# Patient Record
Sex: Female | Born: 1990
Health system: Southern US, Community
[De-identification: ages and names within clinical notes are randomized; demographics above are authoritative.]

## PROBLEM LIST (undated history)

## (undated) HISTORY — PX: NO PAST SURGERIES: SHX2092

---

## 2019-11-06 ENCOUNTER — Other Ambulatory Visit: Payer: Self-pay

## 2019-11-06 ENCOUNTER — Encounter (HOSPITAL_COMMUNITY): Payer: Self-pay | Admitting: Emergency Medicine

## 2019-11-06 ENCOUNTER — Other Ambulatory Visit (HOSPITAL_COMMUNITY): Payer: Medicaid Other

## 2019-11-06 ENCOUNTER — Ambulatory Visit (HOSPITAL_COMMUNITY)
Admission: EM | Admit: 2019-11-06 | Discharge: 2019-11-06 | Disposition: A | Discharge status: Home / Self Care | Payer: Medicaid Other | Attending: Emergency Medicine | Admitting: Emergency Medicine

## 2019-11-06 ENCOUNTER — Emergency Department (HOSPITAL_COMMUNITY): Payer: Medicaid Other

## 2019-11-06 ENCOUNTER — Emergency Department (HOSPITAL_COMMUNITY)
Admission: EM | Admit: 2019-11-06 | Discharge: 2019-11-06 | Disposition: A | Discharge status: Home / Self Care | Payer: Medicaid Other | Attending: Emergency Medicine | Admitting: Emergency Medicine

## 2019-11-06 DIAGNOSIS — N739 Female pelvic inflammatory disease, unspecified: Secondary | ICD-10-CM | POA: Insufficient documentation

## 2019-11-06 DIAGNOSIS — R1031 Right lower quadrant pain: Secondary | ICD-10-CM

## 2019-11-06 DIAGNOSIS — R1084 Generalized abdominal pain: Secondary | ICD-10-CM

## 2019-11-06 DIAGNOSIS — R110 Nausea: Secondary | ICD-10-CM | POA: Insufficient documentation

## 2019-11-06 DIAGNOSIS — R102 Pelvic and perineal pain: Secondary | ICD-10-CM

## 2019-11-06 DIAGNOSIS — N83201 Unspecified ovarian cyst, right side: Secondary | ICD-10-CM | POA: Insufficient documentation

## 2019-11-06 LAB — COMPREHENSIVE METABOLIC PANEL
ALT: 36 U/L (ref 0–44)
AST: 35 U/L (ref 15–41)
Albumin: 3.9 g/dL (ref 3.5–5.0)
Alkaline Phosphatase: 75 U/L (ref 38–126)
Anion gap: 9 (ref 5–15)
BUN: 8 mg/dL (ref 6–20)
CO2: 23 mmol/L (ref 22–32)
Calcium: 9.6 mg/dL (ref 8.9–10.3)
Chloride: 107 mmol/L (ref 98–111)
Creatinine, Ser: 0.81 mg/dL (ref 0.44–1.00)
GFR calc Af Amer: >60 mL/min (ref >60)
GFR calc non Af Amer: >60 mL/min (ref >60)
Glucose, Bld: 78 mg/dL (ref 70–99)
Potassium: 3.9 mmol/L (ref 3.5–5.1)
Sodium: 139 mmol/L (ref 135–145)
Total Bilirubin: 0.8 mg/dL (ref 0.3–1.2)
Total Protein: 8.3 g/dL — ABNORMAL HIGH (ref 6.5–8.1)

## 2019-11-06 LAB — URINALYSIS, ROUTINE W REFLEX MICROSCOPIC
Bilirubin Urine: NEGATIVE
Glucose, UA: NEGATIVE mg/dL
Hgb urine dipstick: NEGATIVE
Ketones, ur: 5 mg/dL — AB
Leukocytes,Ua: NEGATIVE
Nitrite: NEGATIVE
Protein, ur: NEGATIVE mg/dL
Specific Gravity, Urine: 1.005 (ref 1.005–1.030)
pH: 6 (ref 5.0–8.0)

## 2019-11-06 LAB — WET PREP, GENITAL
Clue Cells Wet Prep HPF POC: NONE SEEN
Trich, Wet Prep: NONE SEEN
Yeast Wet Prep HPF POC: NONE SEEN

## 2019-11-06 LAB — POCT URINALYSIS DIP (DEVICE)
Bilirubin Urine: NEGATIVE
Glucose, UA: NEGATIVE mg/dL
Ketones, ur: NEGATIVE mg/dL
Leukocytes,Ua: NEGATIVE
Nitrite: NEGATIVE
Protein, ur: NEGATIVE mg/dL
Specific Gravity, Urine: 1.025 (ref 1.005–1.030)
Urobilinogen, UA: 0.2 mg/dL (ref 0.0–1.0)
pH: 5.5 (ref 5.0–8.0)

## 2019-11-06 LAB — CBC
HCT: 37.2 % (ref 36.0–46.0)
Hemoglobin: 12.3 g/dL (ref 12.0–15.0)
MCH: 22.8 pg — ABNORMAL LOW (ref 26.0–34.0)
MCHC: 33.1 g/dL (ref 30.0–36.0)
MCV: 68.9 fL — ABNORMAL LOW (ref 80.0–100.0)
Platelets: 268 10*3/uL (ref 150–400)
RBC: 5.4 MIL/uL — ABNORMAL HIGH (ref 3.87–5.11)
RDW: 14.4 % (ref 11.5–15.5)
WBC: 4.5 10*3/uL (ref 4.0–10.5)
nRBC: 0 % (ref 0.0–0.2)

## 2019-11-06 LAB — LIPASE, BLOOD: Lipase: 35 U/L (ref 11–51)

## 2019-11-06 LAB — I-STAT BETA HCG BLOOD, ED (MC, WL, AP ONLY): I-stat hCG, quantitative: <5 m[IU]/mL (ref <5)

## 2019-11-06 MED ORDER — DOXYCYCLINE HYCLATE 100 MG PO CAPS: 100.0000 mg | ORAL_CAPSULE | Freq: Two times a day (BID) | ORAL | 0 refills | Status: AC

## 2019-11-06 MED ORDER — CEFTRIAXONE SODIUM 500 MG IJ SOLR
250.0000 mg | Freq: Once | INTRAMUSCULAR | Status: AC
  Administered 2019-11-06: 250 mg via INTRAMUSCULAR
  Filled 2019-11-06: qty 500

## 2019-11-06 MED ORDER — MORPHINE SULFATE (PF) 4 MG/ML IV SOLN
INTRAVENOUS | Status: AC
  Filled 2019-11-06: qty 1

## 2019-11-06 MED ORDER — SODIUM CHLORIDE 0.9% FLUSH: 3.0000 mL | Freq: Once | INTRAVENOUS | Status: DC

## 2019-11-06 MED ORDER — NAPROXEN 500 MG PO TABS: 500.0000 mg | ORAL_TABLET | Freq: Two times a day (BID) | ORAL | 0 refills | Status: DC

## 2019-11-06 MED ORDER — KETOROLAC TROMETHAMINE 30 MG/ML IJ SOLN
30.0000 mg | Freq: Once | INTRAMUSCULAR | Status: AC
  Administered 2019-11-06: 30 mg via INTRAVENOUS
  Filled 2019-11-06: qty 1

## 2019-11-06 MED ORDER — DOXYCYCLINE HYCLATE 100 MG PO TABS
100.0000 mg | ORAL_TABLET | Freq: Once | ORAL | Status: AC
  Administered 2019-11-06: 100 mg via ORAL
  Filled 2019-11-06: qty 1

## 2019-11-06 MED ORDER — MORPHINE SULFATE (PF) 4 MG/ML IV SOLN
2.0000 mg | Freq: Once | INTRAVENOUS | Status: AC
  Administered 2019-11-06: 2 mg via INTRAMUSCULAR

## 2019-11-06 MED ORDER — LIDOCAINE HCL (PF) 1 % IJ SOLN
INTRAMUSCULAR | Status: AC
  Administered 2019-11-06: 5 mL
  Filled 2019-11-06: qty 5

## 2019-11-06 NOTE — ED Triage Notes (Signed)
PT sent from [facility] for RLQ pain that has been intermittent x 1 month and severe after intercourse today with mild nausea.  States her pain was 10/10 but is now 0/10 after receiving Morphine at [facility].

## 2019-11-06 NOTE — ED Provider Notes (Signed)
[facility] EMERGENCY DEPARTMENT Provider Note   CSN: [id number] Arrival date & time: 11/06/19  1205     History Chief Complaint  Patient presents with  . Abdominal Pain    Diamond Berry is a 29 y.o. female who presents to ED with a chief complaint of abdominal pain.  Reports intermittent right lower quadrant pain for the past month.  Pain got severely worse today after having intercourse with her boyfriend.  Pain will completely resolve without specific alleviating factor.  Reports nausea but denies any vomiting.  Denies any vaginal discharge, concern for STDs, urinary symptoms.  She has a history of constipation which is worsened by her p.o. iron but no changes to her bowel movements from baseline.  She denies stability of pregnancy as she has had an IUD for the past 5 years.  Denies any sick contacts with similar symptoms, prior abdominal surgeries, fever, shortness of breath.  HPI     History reviewed. No pertinent past medical history.  There are no problems to display for this patient.   History reviewed. No pertinent surgical history.   OB History   No obstetric history on file.     Family History  Problem Relation Age of Onset  . Hypertension Mother   . Hypertension Father   . Thyroid disease Sister     Social History   Tobacco Use  . Smoking status: Never Smoker  Substance Use Topics  . Alcohol use: Yes  . Drug use: Yes    Types: Marijuana    Home Medications Prior to Admission medications   Medication Sig Start Date End Date Taking? Authorizing Provider  doxycycline (VIBRAMYCIN) 100 MG capsule Take 1 capsule (100 mg total) by mouth 2 (two) times daily for 14 days. 11/06/19 11/20/19  Abdiel Blackerby, PA-C  naproxen (NAPROSYN) 500 MG tablet Take 1 tablet (500 mg total) by mouth 2 (two) times daily. 11/06/19   Delia Heady, PA-C    Allergies    Patient has no known allergies.  Review of Systems   Review of Systems  Constitutional:  Negative for appetite change, chills and fever.  HENT: Negative for ear pain, rhinorrhea, sneezing and sore throat.   Eyes: Negative for photophobia and visual disturbance.  Respiratory: Negative for cough, chest tightness, shortness of breath and wheezing.   Cardiovascular: Negative for chest pain and palpitations.  Gastrointestinal: Positive for abdominal pain. Negative for blood in stool, constipation, diarrhea, nausea and vomiting.  Genitourinary: Positive for dyspareunia and pelvic pain. Negative for dysuria, hematuria and urgency.  Musculoskeletal: Negative for myalgias.  Skin: Negative for rash.  Neurological: Negative for dizziness, weakness and light-headedness.    Physical Exam Updated Vital Signs BP (!) 102/59 (BP Location: Right Arm)   Pulse 63   Temp 98.4 F (36.9 C) (Oral)   Resp 16   LMP 10/19/2019   SpO2 100%   Physical Exam Vitals and nursing note reviewed. Exam conducted with a chaperone present.  Constitutional:      General: She is not in acute distress.    Appearance: She is well-developed.  HENT:     Head: Normocephalic and atraumatic.     Nose: Nose normal.  Eyes:     General: No scleral icterus.       Left eye: No discharge.     Conjunctiva/sclera: Conjunctivae normal.  Cardiovascular:     Rate and Rhythm: Normal rate and regular rhythm.     Heart sounds: Normal heart sounds. No murmur. No friction  rub. No gallop.   Pulmonary:     Effort: Pulmonary effort is normal. No respiratory distress.     Breath sounds: Normal breath sounds.  Abdominal:     General: Bowel sounds are normal. There is no distension.     Palpations: Abdomen is soft.     Tenderness: There is no abdominal tenderness. There is no guarding.  Genitourinary:    Vagina: No erythema or bleeding.     Cervix: No cervical motion tenderness.     Uterus: Tender.      Adnexa:        Right: No tenderness.         Left: No tenderness.       Comments: Pelvic exam: normal external genitalia  without evidence of trauma. VULVA: normal appearing vulva with no masses, tenderness or lesion. VAGINA: normal appearing vagina with normal color and discharge, no lesions. CERVIX: normal appearing cervix without lesions, cervical motion tenderness absent, cervical os closed with out purulent discharge; No vaginal discharge. Wet prep and DNA probe for chlamydia and GC obtained. IUD strings visualized   ADNEXA: normal adnexa in size, nontender and no masses UTERUS: uterus is normal size, shape, consistency with slight TTP on bimanual exam. Musculoskeletal:        General: Normal range of motion.     Cervical back: Normal range of motion and neck supple.  Skin:    General: Skin is warm and dry.     Findings: No rash.  Neurological:     Mental Status: She is alert.     Motor: No abnormal muscle tone.     Coordination: Coordination normal.     ED Results / Procedures / Treatments   Labs (all labs ordered are listed, but only abnormal results are displayed) Labs Reviewed  WET PREP, GENITAL - Abnormal; Notable for the following components:      Result Value   WBC, Wet Prep HPF POC MODERATE (*)    All other components within normal limits  COMPREHENSIVE METABOLIC PANEL - Abnormal; Notable for the following components:   Total Protein 8.3 (*)    All other components within normal limits  CBC - Abnormal; Notable for the following components:   RBC 5.40 (*)    MCV 68.9 (*)    MCH 22.8 (*)    All other components within normal limits  URINALYSIS, ROUTINE W REFLEX MICROSCOPIC - Abnormal; Notable for the following components:   Color, Urine STRAW (*)    Ketones, ur 5 (*)    All other components within normal limits  LIPASE, BLOOD  I-STAT BETA HCG BLOOD, ED ([facility], [facility], [facility] ONLY)  GC/CHLAMYDIA PROBE AMP ([place]) NOT AT [facility]    EKG None  Radiology US PELVIC COMPLETE W TRANSVAGINAL AND TORSION R/O  Result Date: 11/06/2019 CLINICAL DATA:  Right lower quadrant and right pelvic pain.  EXAM: TRANSABDOMINAL AND TRANSVAGINAL ULTRASOUND OF PELVIS DOPPLER ULTRASOUND OF OVARIES TECHNIQUE: Both transabdominal and transvaginal ultrasound examinations of the pelvis were performed. Transabdominal technique was performed for global imaging of the pelvis including uterus, ovaries, adnexal regions, and pelvic cul-de-sac. It was necessary to proceed with endovaginal exam following the transabdominal exam to visualize the ovaries. Color and duplex Doppler ultrasound was utilized to evaluate blood flow to the ovaries. COMPARISON:  None. FINDINGS: Uterus Measurements: 7 x 4.2 x 5.6 cm = volume: 86 mL.  An IUD is noted. Endometrium Thickness: 5.2 mm.  No focal abnormality visualized. Right ovary Measurements: 5.4 x 2.5 x  3.9 cm = volume: 27.8 mL. There is a complex 2.7 x 2.7 x 2.3 cm mass. The mass demonstrates low level internal echoes. There is increased through transmission. Left ovary Measurements: 2.9 x 1.2 x 2.4 cm = volume: 4 mL. Normal appearance/no adnexal mass. Pulsed Doppler evaluation of both ovaries demonstrates normal low-resistance arterial and venous waveforms. Other findings There is a small amount of free fluid in the patient's pelvis. IMPRESSION: 1. No evidence for ovarian torsion. 2. Small amount of pelvic free fluid, likely physiologic. 3. Probable 2.7 cm hemorrhagic cyst involving the right ovary. Electronically Signed   By: Katherine Mantle M.D.   On: 11/06/2019 15:54    Procedures Procedures (including critical care time)  Medications Ordered in ED Medications  sodium chloride flush (NS) 0.9 % injection 3 mL (has no administration in time range)  cefTRIAXone (ROCEPHIN) injection 250 mg (has no administration in time range)  doxycycline (VIBRA-TABS) tablet 100 mg (has no administration in time range)  ketorolac (TORADOL) 30 MG/ML injection 30 mg (30 mg Intravenous Given 11/06/19 1355)    ED Course  I have reviewed the triage vital signs and the nursing notes.  Pertinent  labs & imaging results that were available during my care of the patient were reviewed by me and considered in my medical decision making (see chart for details).    MDM Rules/Calculators/A&P                      29 year old female presenting to the ED with lower abdominal/pelvic pain.  Pain has been intermittent for the past month but worsened this morning while having intercourse with her boyfriend.  She is sent over from urgent care due to her location and severity of pain.  Overall she is well-appearing after receiving morphine by urgent care.  Her pelvic exam revealed no cervical motion tenderness but she did have extreme discomfort with the speculum exam itself.  I was able to visualize her IUD strings in place.  She had no adnexal tenderness.  Work appears significant for normal CBC, CMP, lipase, urinalysis and negative hCG.  Wet prep positive for WBC but no other abnormalities.  Pelvic ultrasound revealed a right sided hemorrhagic ovarian cyst. This could be the cause of her discomfort but due to her tenderness during speculum exam will cover her for PID.  Discussed risks and benefits of further imaging with a CT scan and patient declines but knows to return to the ED for any worsening symptoms.  Patient is hemodynamically stable, in NAD, and able to ambulate in the ED. Evaluation does not show pathology that would require ongoing emergent intervention or inpatient treatment. I explained the diagnosis to the patient. Pain has been managed and has no complaints prior to discharge. Patient is comfortable with above plan and is stable for discharge at this time. All questions were answered prior to disposition. Strict return precautions for returning to the ED were discussed. Encouraged follow up with PCP.   An After Visit Summary was printed and given to the patient.   Portions of this note were generated with Scientist, clinical (histocompatibility and immunogenetics). Dictation errors may occur despite best attempts at  proofreading.  Final Clinical Impression(s) / ED Diagnoses Final diagnoses:  Cyst of right ovary  Pelvic inflammatory disease (PID)    Rx / DC Orders ED Discharge Orders         Ordered    doxycycline (VIBRAMYCIN) 100 MG capsule  2 times daily  11/06/19 1623    naproxen (NAPROSYN) 500 MG tablet  2 times daily     11/06/19 1623           Dietrich Pates, PA-C 11/06/19 1623    Gerhard Munch, MD 11/11/19 [phone]

## 2019-11-06 NOTE — ED Triage Notes (Signed)
Pt states shes had some lower abdominal pain off and on x1 month, then states she had intercourse with her boyfriend today and afterward she had severe lower abdominal pain. Pt states she has an IUD. Tender to palpation on RLQ.

## 2019-11-06 NOTE — ED Provider Notes (Addendum)
HPI  SUBJECTIVE:  Diamond Berry is a 29 y.o. female who presents with intermittent right lower quadrant pain for the past month.  Was having intercourse today with her boyfriend, states that the pain got acutely worse.  She denies dyspareunia.  She describes the pain as sharp, intermittent, minutes long.  Does not migrate or radiate. when it comes, it is the worst at onset, and then completely resolves.  She reports nausea when the pain is present.  No vomiting, fevers.  No abdominal distention.  She reports an episode of low abdominal pressure with urination today, but this has resolved.  She denies dysuria, urinary urgency, frequency, cloudy or odorous urine, hematuria.  No vaginal itching, discharge, odor genital rash.  She is in a long-term monogamous relationship with a female who is asymptomatic, STDs are not a concern today.  She denies back pain.  Reports anorexia.  She states that the pain is better when she curls into a fetal position, and stands.  Symptoms are worse with large positional changes/movement.  Is not associate with eating drinking urination or defecation.  Patient states that the car ride over here was painful.  She had a normal bowel movement yesterday.  She has never had symptoms like this before.  Past medical history negative for PCOS, torsion, ovarian cyst, gonorrhea, chlamydia, HIV, HSV, syphilis, trichomonas, BV, PID, ectopic pregnancies, abdominal surgeries.  She has history of yeast infections.  LMP: 1/6-1/9 has an IUD.  Doubts that she could be pregnant.  PMD: None.    History reviewed. No pertinent past medical history.  History reviewed. No pertinent surgical history.  Family History  Problem Relation Age of Onset  . Hypertension Mother   . Hypertension Father   . Thyroid disease Sister     Social History   Tobacco Use  . Smoking status: Never Smoker  Substance Use Topics  . Alcohol use: Yes  . Drug use: Yes    Types: Marijuana     Current  Facility-Administered Medications:  .  morphine 4 MG/ML injection 2 mg, 2 mg, Intramuscular, Once, Melynda Ripple, MD No current outpatient medications on file.  No Known Allergies   ROS  As noted in HPI.   Physical Exam  BP (!) 109/49   Pulse 73   Temp 98.8 F (37.1 C)   Resp 16   SpO2 100%   Constitutional: Well developed, well nourished, no acute distress. Eyes:  EOMI, conjunctiva normal bilaterally HENT: Normocephalic, atraumatic,mucus membranes moist Respiratory: Normal inspiratory effort Cardiovascular: Normal rate GI: Normal appearance, flat, soft.  Positive left flank, periumbilical, and suprapubic tenderness.  No right lower quadrant tenderness.  Negative Murphy.  Voluntary guarding.  No guarding, rebound.  Tap table test negative. Back: No CVAT skin: No rash, skin intact Musculoskeletal: no deformities Neurologic: Alert & oriented x 3, no focal neuro deficits Psychiatric: Speech and behavior appropriate   ED Course   Medications  morphine 4 MG/ML injection 2 mg (has no administration in time range)    No orders of the defined types were placed in this encounter.   No results found for this or any previous visit (from the past 24 hour(s)). No results found.  ED Clinical Impression  1. Generalized abdominal pain      ED Assessment/Plan  History was concerning for ovarian torsion, however she has no tenderness in the right lower quadrant.  Doubt appendicitis due to duration of symptoms.  She does have periumbilical, left flank and suprapubic tenderness.  Physical exam  was very painful, patient started crying and was requested something for pain.  Declined nausea medication.  She doubts that she could be pregnant, LMP was January 6-9, she has an IUD.  Feel that we need more information before we can safely send her home.  Ovarian cyst versus torsion based on history, colitis, pancreatitis, UTI, nephrolithiasis based on exam.  Also in the differential is  IUD displacement.  Doubt uterine perforation.  Transferring to the emergency department for comprehensive evaluation.  Giving 2 mg of morphine IM prior to discharge.  Advised her to be n.p.o. until ER evaluation is complete.  Discussed  MDM, treatment plan, and rationale for transfer to the emergency department.  Patient agrees the plan  Meds ordered this encounter  Medications  . morphine 4 MG/ML injection 2 mg    *This clinic note was created using Scientist, clinical (histocompatibility and immunogenetics). Therefore, there may be occasional mistakes despite careful proofreading.   ?    Domenick Gong, MD 11/06/19 1152    Domenick Gong, MD 11/06/19 1155

## 2019-11-06 NOTE — Discharge Instructions (Addendum)
I am giving you 2 mg of morphine for the pain to help you while you are waiting to be seen.  Do not have anything further to eat or drink until your ER evaluation is complete.  Let them know if your pain changes, gets worse, or for any other concerns.

## 2019-11-06 NOTE — Discharge Instructions (Signed)
Take the antibiotics for the entire course prevent worsening or recurrence of your infection. We will follow up with the results of your lab work when it is available. Take naproxen as needed for pain. Return to the ED if you start to develop worsening pain, abnormal vaginal bleeding, inability to tolerate anything by mouth, shortness of breath.

## 2019-11-06 NOTE — ED Notes (Signed)
Patient is being discharged from the Urgent Care Center and sent to the Emergency Department via wheelchair by staff. Per Dr Terrilee Croak, patient is stable but in need of higher level of care due to severe abdominal pain. Patient is aware and verbalizes understanding of plan of care.    Vitals:   11/06/19 1056  BP: (!) 109/49  Pulse: 73  Resp: 16  Temp: 98.8 F (37.1 C)  SpO2: 100%

## 2019-11-06 NOTE — ED Notes (Signed)
DC instructions reviewed with PT.

## 2019-11-07 ENCOUNTER — Telehealth: Payer: Self-pay | Admitting: General Practice

## 2019-11-07 LAB — GC/CHLAMYDIA PROBE AMP (~~LOC~~) NOT AT ARMC
Chlamydia: NEGATIVE
Neisseria Gonorrhea: NEGATIVE

## 2019-11-25 ENCOUNTER — Other Ambulatory Visit: Payer: Self-pay

## 2019-11-25 ENCOUNTER — Encounter: Payer: Self-pay | Admitting: Internal Medicine

## 2019-11-25 ENCOUNTER — Ambulatory Visit: Payer: Medicaid Other | Attending: Internal Medicine | Admitting: Internal Medicine

## 2019-11-25 VITALS — BP 120/78 | HR 65 | Ht 63.0 in | Wt 138.0 lb

## 2019-11-25 DIAGNOSIS — F419 Anxiety disorder, unspecified: Secondary | ICD-10-CM | POA: Diagnosis not present

## 2019-11-25 DIAGNOSIS — Z2821 Immunization not carried out because of patient refusal: Secondary | ICD-10-CM

## 2019-11-25 DIAGNOSIS — Z833 Family history of diabetes mellitus: Secondary | ICD-10-CM | POA: Insufficient documentation

## 2019-11-25 DIAGNOSIS — Z6379 Other stressful life events affecting family and household: Secondary | ICD-10-CM | POA: Diagnosis not present

## 2019-11-25 DIAGNOSIS — N83201 Unspecified ovarian cyst, right side: Secondary | ICD-10-CM | POA: Insufficient documentation

## 2019-11-25 DIAGNOSIS — Z3009 Encounter for other general counseling and advice on contraception: Secondary | ICD-10-CM

## 2019-11-25 DIAGNOSIS — Z308 Encounter for other contraceptive management: Secondary | ICD-10-CM | POA: Diagnosis not present

## 2019-11-25 NOTE — Progress Notes (Signed)
Patient ID: Diamond Berry, female    DOB: Feb 23, 1991  MRN: [id number]  CC: Hospitalization Follow-up   Subjective: Diamond Berry is a 29 y.o. female who presents for ER f/u and new pt visit Her concerns today include:   No previous PCP here in [place].  Relocated from [place] 05/2019 No chronic med illness  Patient seen in the emergency room 11/06/2019 with some right lower quadrant abdominal pain.  Pelvic ultrasound revealed a right hemorrhagic ovarian cyst.  Urine pregnancy test was negative.  Screen for GC and Chlamydia were negative.  IUD noted to be in place on physical exam.  Patient discharged on Naprosyn.  She reports that the pain is less frequent. Wants IUD removed.  Had it since 2016. Was on Depo in past but changed to IUD out of convenience.  She would like to get back on the Depo-Provera.   No wgh gain or irregular bleeding when she was on Depo in the past.  Last PAP was within the past yr and was nl.  It was done in [place].  Wants referral to see a therapist Feeling overwhelm.  She is a single mom with 2 kids and is in the middle of a divorce. Needs someone to talk to Saw therapist in past with her then husband and found it helpful Poor appetite some days.  Feels tired all the time.  + crying spells.  She gets up and goes to work every day On med for chronic daily in past for daily HA and was told it may be antidepressant but other than that she has never been on an antidepressant.  She is not interested in being placed on medication at this time.  She denies any suicidal ideation.   Past medical, social, family history and surgical history is reviewed and updated. Current Outpatient Medications on File Prior to Visit  Medication Sig Dispense Refill  . naproxen (NAPROSYN) 500 MG tablet Take 1 tablet (500 mg total) by mouth 2 (two) times daily. (Patient not taking: Reported on 11/25/2019) 30 tablet 0   No current facility-administered medications on file prior to  visit.    No Known Allergies  Social History   Socioeconomic History  . Marital status: Single    Spouse name: Not on file  . Number of children: 2  . Years of education: Not on file  . Highest education level: Not on file  Occupational History  . Occupation: Print production planner  Tobacco Use  . Smoking status: Never Smoker  . Smokeless tobacco: Never Used  Substance and Sexual Activity  . Alcohol use: Yes    Comment: special occasions  . Drug use: Yes    Frequency: 1.0 times per week    Types: Marijuana  . Sexual activity: Yes    Birth control/protection: I.U.D.  Other Topics Concern  . Not on file  Social History Narrative  . Not on file   Social Determinants of Health   Financial Resource Strain:   . Difficulty of Paying Living Expenses: Not on file  Food Insecurity:   . Worried About Programme researcher, broadcasting/film/video in the Last Year: Not on file  . Ran Out of Food in the Last Year: Not on file  Transportation Needs:   . Lack of Transportation (Medical): Not on file  . Lack of Transportation (Non-Medical): Not on file  Physical Activity:   . Days of Exercise per Week: Not on file  . Minutes of Exercise per Session: Not on file  Stress:   . Feeling of Stress : Not on file  Social Connections:   . Frequency of Communication with Friends and Family: Not on file  . Frequency of Social Gatherings with Friends and Family: Not on file  . Attends Religious Services: Not on file  . Active Member of Clubs or Organizations: Not on file  . Attends Archivist Meetings: Not on file  . Marital Status: Not on file  Intimate Partner Violence:   . Fear of Current or Ex-Partner: Not on file  . Emotionally Abused: Not on file  . Physically Abused: Not on file  . Sexually Abused: Not on file    Family History  Problem Relation Age of Onset  . Hypertension Mother   . Diabetes Mother   . Hypertension Father   . Thyroid disease Sister   . Hypertension Sister     Past Surgical  History:  Procedure Laterality Date  . NO PAST SURGERIES      ROS: Review of Systems Negative except as stated above  PHYSICAL EXAM: BP 120/78   Pulse 65   Ht 5\' 3"  (1.6 m)   Wt 138 lb (62.6 kg)   SpO2 100%   BMI 24.45 kg/m   Physical Exam  General appearance - alert, well appearing, young African-American female and in no distress Mental status - normal mood, behavior, speech, dress, motor activity, and thought processes Chest - clear to auscultation, no wheezes, rales or rhonchi, symmetric air entry Heart - normal rate, regular rhythm, normal S1, S2, no murmurs, rubs, clicks or gallops Extremities - peripheral pulses normal, no pedal edema, no clubbing or cyanosis  Depression screen PHQ 2/9 11/25/2019  Decreased Interest 2  Down, Depressed, Hopeless 1  PHQ - 2 Score 3  Altered sleeping 2  Tired, decreased energy 0  Change in appetite 1  Feeling bad or failure about yourself  2  Trouble concentrating 0  Moving slowly or fidgety/restless 0  Suicidal thoughts 0  PHQ-9 Score 8   GAD 7 : Generalized Anxiety Score 11/25/2019  Nervous, Anxious, on Edge 3  Control/stop worrying 3  Worry too much - different things 3  Trouble relaxing 1  Restless 0  Easily annoyed or irritable 2  Afraid - awful might happen 2  Total GAD 7 Score 14     CMP Latest Ref Rng & Units 11/06/2019  Glucose 70 - 99 mg/dL 78  BUN 6 - 20 mg/dL 8  Creatinine 0.44 - 1.00 mg/dL 0.81  Sodium 135 - 145 mmol/L 139  Potassium 3.5 - 5.1 mmol/L 3.9  Chloride 98 - 111 mmol/L 107  CO2 22 - 32 mmol/L 23  Calcium 8.9 - 10.3 mg/dL 9.6  Total Protein 6.5 - 8.1 g/dL 8.3(H)  Total Bilirubin 0.3 - 1.2 mg/dL 0.8  Alkaline Phos 38 - 126 U/L 75  AST 15 - 41 U/L 35  ALT 0 - 44 U/L 36   Lipid Panel  No results found for: CHOL, TRIG, HDL, CHOLHDL, VLDL, LDLCALC, LDLDIRECT  CBC    Component Value Date/Time   WBC 4.5 11/06/2019 1218   RBC 5.40 (H) 11/06/2019 1218   HGB 12.3 11/06/2019 1218   HCT 37.2  11/06/2019 1218   PLT 268 11/06/2019 1218   MCV 68.9 (L) 11/06/2019 1218   MCH 22.8 (L) 11/06/2019 1218   MCHC 33.1 11/06/2019 1218   RDW 14.4 11/06/2019 1218    ASSESSMENT AND PLAN: 1. Stressful life event affecting family Referral placed to behavioral  health with request for her to see a counselor. Discussed ways to help reduce stress including regular exercise, eating healthy, getting adequate sleep, breathing exercises - Ambulatory referral to Psychiatry  2. Family planning We will refer her to GYN to have her IUD removed - Ambulatory referral to Gynecology  3. Cyst of right ovary - Ambulatory referral to Gynecology  4. Influenza vaccination declined This was offered and patient declined  5. Tetanus, diphtheria, and acellular pertussis (Tdap) vaccination declined This was offered and patient declined    Patient was given the opportunity to ask questions.  Patient verbalized understanding of the plan and was able to repeat key elements of the plan.   Orders Placed This Encounter  Procedures  . Ambulatory referral to Gynecology  . Ambulatory referral to Psychiatry     Requested Prescriptions    No prescriptions requested or ordered in this encounter    Return if symptoms worsen or fail to improve.  Jonah Blue, MD, FACP

## 2019-11-25 NOTE — Patient Instructions (Signed)
Stress, Adult Stress is a normal reaction to life events. Stress is what you feel when life demands more than you are used to, or more than you think you can handle. Some stress can be useful, such as studying for a test or meeting a deadline at work. Stress that occurs too often or for too long can cause problems. It can affect your emotional health and interfere with relationships and normal daily activities. Too much stress can weaken your body's defense system (immune system) and increase your risk for physical illness. If you already have a medical problem, stress can make it worse. What are the causes? All sorts of life events can cause stress. An event that causes stress for one person may not be stressful for another person. Major life events, whether positive or negative, commonly cause stress. Examples include:  Losing a job or starting a new job.  Losing a loved one.  Moving to a new town or home.  Getting married or divorced.  Having a baby.  Getting injured or sick. Less obvious life events can also cause stress, especially if they occur day after day or in combination with each other. Examples include:  Working long hours.  Driving in traffic.  Caring for children.  Being in debt.  Being in a difficult relationship. What are the signs or symptoms? Stress can cause emotional symptoms, including:  Anxiety. This is feeling worried, afraid, on edge, overwhelmed, or out of control.  Anger, including irritation or impatience.  Depression. This is feeling sad, down, helpless, or guilty.  Trouble focusing, remembering, or making decisions. Stress can cause physical symptoms, including:  Aches and pains. These may affect your head, neck, back, stomach, or other areas of your body.  Tight muscles or a clenched jaw.  Low energy.  Trouble sleeping. Stress can cause unhealthy behaviors, including:  Eating to feel better (overeating) or skipping meals.  Working too  much or putting off tasks.  Smoking, drinking alcohol, or using drugs to feel better. How is this diagnosed? Stress is diagnosed through an assessment by your health care provider. He or she may diagnose this condition based on:  Your symptoms and any stressful life events.  Your medical history.  Tests to rule out other causes of your symptoms. Depending on your condition, your health care provider may refer you to a specialist for further evaluation. How is this treated?  Stress management techniques are the recommended treatment for stress. Medicine is not typically recommended for the treatment of stress. Techniques to reduce your reaction to stressful life events include:  Stress identification. Monitor yourself for symptoms of stress and identify what causes stress for you. These skills may help you to avoid or prepare for stressful events.  Time management. Set your priorities, keep a calendar of events, and learn to say no. Taking these actions can help you avoid making too many commitments. Techniques for coping with stress include:  Rethinking the problem. Try to think realistically about stressful events rather than ignoring them or overreacting. Try to find the positives in a stressful situation rather than focusing on the negatives.  Exercise. Physical exercise can release both physical and emotional tension. The key is to find a form of exercise that you enjoy and do it regularly.  Relaxation techniques. These relax the body and mind. The key is to find one or more that you enjoy and use the techniques regularly. Examples include: ? Meditation, deep breathing, or progressive relaxation techniques. ? Yoga or   tai chi. ? Biofeedback, mindfulness techniques, or journaling. ? Listening to music, being out in nature, or participating in other hobbies.  Practicing a healthy lifestyle. Eat a balanced diet, drink plenty of water, limit or avoid caffeine, and get plenty of  sleep.  Having a strong support network. Spend time with family, friends, or other people you enjoy being around. Express your feelings and talk things over with someone you trust. Counseling or talk therapy with a mental health professional may be helpful if you are having trouble managing stress on your own. Follow these instructions at home: Lifestyle   Avoid drugs.  Do not use any products that contain nicotine or tobacco, such as cigarettes, e-cigarettes, and chewing tobacco. If you need help quitting, ask your health care provider.  Limit alcohol intake to no more than 1 drink a day for nonpregnant women and 2 drinks a day for men. One drink equals 12 oz of beer, 5 oz of wine, or 1 oz of hard liquor  Do not use alcohol or drugs to relax.  Eat a balanced diet that includes fresh fruits and vegetables, whole grains, lean meats, fish, eggs, and beans, and low-fat dairy. Avoid processed foods and foods high in added fat, sugar, and salt.  Exercise at least 30 minutes on 5 or more days each week.  Get 7-8 hours of sleep each night. General instructions   Practice stress management techniques as discussed with your health care provider.  Drink enough fluid to keep your urine clear or pale yellow.  Take over-the-counter and prescription medicines only as told by your health care provider.  Keep all follow-up visits as told by your health care provider. This is important. Contact a health care provider if:  Your symptoms get worse.  You have new symptoms.  You feel overwhelmed by your problems and can no longer manage them on your own. Get help right away if:  You have thoughts of hurting yourself or others. If you ever feel like you may hurt yourself or others, or have thoughts about taking your own life, get help right away. You can go to your nearest emergency department or call:  Your local emergency services (911 in the [place]  A suicide crisis helpline, such as the  [place] at (316) 250-6172. This is open 24 hours a day. Summary  Stress is a normal reaction to life events. It can cause problems if it happens too often or for too long.  Practicing stress management techniques is the best way to treat stress.  Counseling or talk therapy with a mental health professional may be helpful if you are having trouble managing stress on your own. This information is not intended to replace advice given to you by your health care provider. Make sure you discuss any questions you have with your health care provider. Document Revised: 04/29/2019 Document Reviewed: 11/19/2016 Elsevier Patient Education  [place].

## 2019-11-25 NOTE — Progress Notes (Signed)
Wants to discuss new form of birth control.

## 2020-01-04 ENCOUNTER — Ambulatory Visit (INDEPENDENT_AMBULATORY_CARE_PROVIDER_SITE_OTHER): Payer: Medicaid Other | Admitting: Obstetrics and Gynecology

## 2020-01-04 ENCOUNTER — Encounter: Payer: Self-pay | Admitting: Obstetrics and Gynecology

## 2020-01-04 ENCOUNTER — Other Ambulatory Visit: Payer: Self-pay

## 2020-01-04 VITALS — BP 116/62 | HR 82 | Ht 63.0 in | Wt 132.7 lb

## 2020-01-04 DIAGNOSIS — Z3042 Encounter for surveillance of injectable contraceptive: Secondary | ICD-10-CM | POA: Diagnosis present

## 2020-01-04 DIAGNOSIS — Z30432 Encounter for removal of intrauterine contraceptive device: Secondary | ICD-10-CM

## 2020-01-04 MED ORDER — MEDROXYPROGESTERONE ACETATE 150 MG/ML IM SUSP
150.0000 mg | Freq: Once | INTRAMUSCULAR | Status: AC
  Administered 2020-01-04: 150 mg via INTRAMUSCULAR

## 2020-01-04 MED ORDER — MEDROXYPROGESTERONE ACETATE 150 MG/ML IM SUSP: 150.0000 mg | INTRAMUSCULAR | 3 refills | Status: DC

## 2020-01-04 NOTE — Patient Instructions (Signed)
Health Maintenance, Female Adopting a healthy lifestyle and getting preventive care are important in promoting health and wellness. Ask your health care provider about:  The right schedule for you to have regular tests and exams.  Things you can do on your own to prevent diseases and keep yourself healthy. What should I know about diet, weight, and exercise? Eat a healthy diet   Eat a diet that includes plenty of vegetables, fruits, low-fat dairy products, and lean protein.  Do not eat a lot of foods that are high in solid fats, added sugars, or sodium. Maintain a healthy weight Body mass index (BMI) is used to identify weight problems. It estimates body fat based on height and weight. Your health care provider can help determine your BMI and help you achieve or maintain a healthy weight. Get regular exercise Get regular exercise. This is one of the most important things you can do for your health. Most adults should:  Exercise for at least 150 minutes each week. The exercise should increase your heart rate and make you sweat (moderate-intensity exercise).  Do strengthening exercises at least twice a week. This is in addition to the moderate-intensity exercise.  Spend less time sitting. Even light physical activity can be beneficial. Watch cholesterol and blood lipids Have your blood tested for lipids and cholesterol at 31 years of age, then have this test every 5 years. Have your cholesterol levels checked more often if:  Your lipid or cholesterol levels are high.  You are older than 31 years of age.  You are at high risk for heart disease. What should I know about cancer screening? Depending on your health history and family history, you may need to have cancer screening at various ages. This may include screening for:  Breast cancer.  Cervical cancer.  Colorectal cancer.  Skin cancer.  Lung cancer. What should I know about heart disease, diabetes, and high blood  pressure? Blood pressure and heart disease  High blood pressure causes heart disease and increases the risk of stroke. This is more likely to develop in people who have high blood pressure readings, are of African descent, or are overweight.  Have your blood pressure checked: ? Every 3-5 years if you are 18-39 years of age. ? Every year if you are 40 years old or older. Diabetes Have regular diabetes screenings. This checks your fasting blood sugar level. Have the screening done:  Once every three years after age 40 if you are at a normal weight and have a low risk for diabetes.  More often and at a younger age if you are overweight or have a high risk for diabetes. What should I know about preventing infection? Hepatitis B If you have a higher risk for hepatitis B, you should be screened for this virus. Talk with your health care provider to find out if you are at risk for hepatitis B infection. Hepatitis C Testing is recommended for:  Everyone born from 1945 through 1965.  Anyone with known risk factors for hepatitis C. Sexually transmitted infections (STIs)  Get screened for STIs, including gonorrhea and chlamydia, if: ? You are sexually active and are younger than 31 years of age. ? You are older than 31 years of age and your health care provider tells you that you are at risk for this type of infection. ? Your sexual activity has changed since you were last screened, and you are at increased risk for chlamydia or gonorrhea. Ask your health care provider if   you are at risk.  Ask your health care provider about whether you are at high risk for HIV. Your health care provider may recommend a prescription medicine to help prevent HIV infection. If you choose to take medicine to prevent HIV, you should first get tested for HIV. You should then be tested every 3 months for as long as you are taking the medicine. Pregnancy  If you are about to stop having your period (premenopausal) and  you may become pregnant, seek counseling before you get pregnant.  Take 400 to 800 micrograms (mcg) of folic acid every day if you become pregnant.  Ask for birth control (contraception) if you want to prevent pregnancy. Osteoporosis and menopause Osteoporosis is a disease in which the bones lose minerals and strength with aging. This can result in bone fractures. If you are 65 years old or older, or if you are at risk for osteoporosis and fractures, ask your health care provider if you should:  Be screened for bone loss.  Take a calcium or vitamin D supplement to lower your risk of fractures.  Be given hormone replacement therapy (HRT) to treat symptoms of menopause. Follow these instructions at home: Lifestyle  Do not use any products that contain nicotine or tobacco, such as cigarettes, e-cigarettes, and chewing tobacco. If you need help quitting, ask your health care provider.  Do not use street drugs.  Do not share needles.  Ask your health care provider for help if you need support or information about quitting drugs. Alcohol use  Do not drink alcohol if: ? Your health care provider tells you not to drink. ? You are pregnant, may be pregnant, or are planning to become pregnant.  If you drink alcohol: ? Limit how much you use to 0-1 drink a day. ? Limit intake if you are breastfeeding.  Be aware of how much alcohol is in your drink. In the [place] one drink equals one 12 oz bottle of beer (355 mL), one 5 oz glass of wine (148 mL), or one 1 oz glass of hard liquor (44 mL). General instructions  Schedule regular health, dental, and eye exams.  Stay current with your vaccines.  Tell your health care provider if: ? You often feel depressed. ? You have ever been abused or do not feel safe at home. Summary  Adopting a healthy lifestyle and getting preventive care are important in promoting health and wellness.  Follow your health care provider's instructions about healthy  diet, exercising, and getting tested or screened for diseases.  Follow your health care provider's instructions on monitoring your cholesterol and blood pressure. This information is not intended to replace advice given to you by your health care provider. Make sure you discuss any questions you have with your health care provider. Document Revised: 09/22/2018 Document Reviewed: 09/22/2018 Elsevier Patient Education  2020 Elsevier Inc.  

## 2020-01-04 NOTE — Progress Notes (Signed)
Patient ID: Diamond Berry, female   DOB: 1990/11/05, 29 y.o.   MRN: [id number]    GYNECOLOGY CLINIC PROCEDURE NOTE  [name] is a 30 y.o. G2P0 here for Mirena IUD removal. No GYN concerns.  Last pap smear was on 2020 and was normal.  IUD Removal  Patient identified, informed consent performed, consent signed.  Patient was in the dorsal lithotomy position, normal external genitalia was noted.  A speculum was placed in the patient's vagina, normal discharge was noted, no lesions. The cervix was visualized, no lesions, no abnormal discharge.  The strings of the IUD were grasped and pulled using ring forceps. The IUD was removed in its entirety.   Patient tolerated the procedure well.    Patient will use depo provera for contraception. Injection today and Rx sent to pharmacy  Routine preventative health maintenance measures emphasized.   Nettie Elm, MD, FACOG Attending Obstetrician & Gynecologist Center for [facility], [facility]

## 2020-01-04 NOTE — Progress Notes (Signed)
Pt wants DEPO Shot.

## 2020-02-06 ENCOUNTER — Encounter: Payer: Self-pay | Admitting: *Deleted

## 2020-03-20 ENCOUNTER — Other Ambulatory Visit: Payer: Self-pay

## 2020-03-20 ENCOUNTER — Ambulatory Visit (HOSPITAL_COMMUNITY)
Admission: EM | Admit: 2020-03-20 | Discharge: 2020-03-20 | Disposition: A | Discharge status: Home / Self Care | Payer: Medicaid Other | Attending: Emergency Medicine | Admitting: Emergency Medicine

## 2020-03-20 DIAGNOSIS — S30861A Insect bite (nonvenomous) of abdominal wall, initial encounter: Secondary | ICD-10-CM | POA: Diagnosis not present

## 2020-03-20 DIAGNOSIS — W57XXXA Bitten or stung by nonvenomous insect and other nonvenomous arthropods, initial encounter: Secondary | ICD-10-CM | POA: Diagnosis not present

## 2020-03-20 MED ORDER — CEPHALEXIN 500 MG PO CAPS: 500.0000 mg | ORAL_CAPSULE | Freq: Four times a day (QID) | ORAL | 0 refills | Status: AC

## 2020-03-20 MED ORDER — TRIAMCINOLONE ACETONIDE 0.1 % EX CREA: 1.0000 "application " | TOPICAL_CREAM | Freq: Two times a day (BID) | CUTANEOUS | 0 refills | Status: DC

## 2020-03-20 NOTE — ED Provider Notes (Signed)
[facility]    CSN: [id number] Arrival date & time: 03/20/20  1059      History   Chief Complaint Chief Complaint  Patient presents with  . Insect Bite    HPI Diamond Berry is a 29 y.o. female no significant past medical history presenting today for evaluation of a bug bite. Patient sustained bug bite to her lower abdomen 1-2 days ago. She has had associated itching with it. Has been applying cortisone without relief. She is concerned that she has developed some increased redness. Does feel like her belt line has been rubbing this area. Denies fevers. Does endorse headaches. Eating and drinking normally. Denies lesions elsewhere.  HPI  No past medical history on file.  Patient Active Problem List   Diagnosis Date Noted  . Encounter for IUD removal 01/04/2020  . Encounter for surveillance of injectable contraceptive 01/04/2020    Past Surgical History:  Procedure Laterality Date  . NO PAST SURGERIES      OB History    Gravida  2   Para      Term      Preterm      AB      Living  2     SAB      TAB      Ectopic      Multiple      Live Births  2            Home Medications    Prior to Admission medications   Medication Sig Start Date End Date Taking? Authorizing Provider  cycloSPORINE (RESTASIS) 0.05 % ophthalmic emulsion 1 drop 2 (two) times daily.   Yes [provider]  cephALEXin (KEFLEX) 500 MG capsule Take 1 capsule (500 mg total) by mouth 4 (four) times daily for 5 days. 03/20/20 03/25/20  Talib Headley C, PA-C  medroxyPROGESTERone (DEPO-PROVERA) 150 MG/ML injection Inject 1 mL (150 mg total) into the muscle every 3 (three) months. 01/04/20   Hermina Staggers, MD  triamcinolone cream (KENALOG) 0.1 % Apply 1 application topically 2 (two) times daily. 03/20/20   Erricka Falkner, Junius Creamer, PA-C    Family History Family History  Problem Relation Age of Onset  . Hypertension Mother   . Diabetes Mother   . Hypertension Father   .  Thyroid disease Sister   . Hypertension Sister     Social History Social History   Tobacco Use  . Smoking status: Never Smoker  . Smokeless tobacco: Never Used  Substance Use Topics  . Alcohol use: Yes    Comment: special occasions  . Drug use: Yes    Frequency: 1.0 times per week    Types: Marijuana     Allergies   Patient has no known allergies.   Review of Systems Review of Systems  Constitutional: Negative for fatigue and fever.  HENT: Negative for mouth sores.   Eyes: Negative for visual disturbance.  Respiratory: Negative for shortness of breath.   Cardiovascular: Negative for chest pain.  Gastrointestinal: Negative for abdominal pain, nausea and vomiting.  Musculoskeletal: Negative for arthralgias and joint swelling.  Skin: Positive for color change. Negative for rash and wound.  Neurological: Negative for dizziness, weakness, light-headedness and headaches.     Physical Exam Triage Vital Signs ED Triage Vitals  Enc Vitals Group     BP 03/20/20 1218 116/75     Pulse Rate 03/20/20 1218 77     Resp 03/20/20 1218 16     Temp 03/20/20 1218 (!)  97.4 F (36.3 C)     Temp Source 03/20/20 1218 Oral     SpO2 03/20/20 1218 100 %     Weight --      Height --      Head Circumference --      Peak Flow --      Pain Score 03/20/20 1219 4     Pain Loc --      Pain Edu? --      Excl. in [place]? --    No data found.  Updated Vital Signs BP 116/75 (BP Location: Right Arm)   Pulse 77   Temp (!) 97.4 F (36.3 C) (Oral)   Resp 16   LMP  (LMP Unknown)   SpO2 100%   Visual Acuity Right Eye Distance:   Left Eye Distance:   Bilateral Distance:    Right Eye Near:   Left Eye Near:    Bilateral Near:     Physical Exam Vitals and nursing note reviewed.  Constitutional:      Appearance: She is well-developed.     Comments: No acute distress  HENT:     Head: Normocephalic and atraumatic.     Nose: Nose normal.  Eyes:     Conjunctiva/sclera: Conjunctivae  normal.  Cardiovascular:     Rate and Rhythm: Normal rate.  Pulmonary:     Effort: Pulmonary effort is normal. No respiratory distress.  Abdominal:     General: There is no distension.  Musculoskeletal:        General: Normal range of motion.     Cervical back: Neck supple.  Skin:    General: Skin is warm and dry.     Comments: Small 0.5 cm area of erythema with central scabbing, very faint surrounding hyperpigmentation/erythema, no induration  Neurological:     Mental Status: She is alert and oriented to person, place, and time.      UC Treatments / Results  Labs (all labs ordered are listed, but only abnormal results are displayed) Labs Reviewed - No data to display  EKG   Radiology No results found.  Procedures Procedures (including critical care time)  Medications Ordered in UC Medications - No data to display  Initial Impression / Assessment and Plan / UC Course  I have reviewed the triage vital signs and the nursing notes.  Pertinent labs & imaging results that were available during my care of the patient were reviewed by me and considered in my medical decision making (see chart for details).     Suspect most likely insect bite with secondary localized inflammation, recommending triamcinolone topically. Area does not appear infected at this time, but providing Keflex to have on hand if developing increased redness or pain to begin taking it signs of infection. Warm compresses, monitor for gradual improvement.  Discussed strict return precautions. Patient verbalized understanding and is agreeable with plan.  Final Clinical Impressions(s) / UC Diagnoses   Final diagnoses:  Insect bite of abdominal wall, initial encounter     Discharge Instructions     Apply triamcinolone cream twice daily to area to help with local inflammation swelling and itching May use Zyrtec/Claritin in the morning, Benadryl at nighttime to further help with itching Developing  increased redness or pain associated with this area may begin Keflex 4 times daily x5 days  Please follow-up for any concerns, symptoms not improving or worsening    ED Prescriptions    Medication Sig Dispense Auth. Provider   triamcinolone cream (KENALOG) 0.1 %  Apply 1 application topically 2 (two) times daily. 45 g Kamilya Wakeman C, PA-C   cephALEXin (KEFLEX) 500 MG capsule Take 1 capsule (500 mg total) by mouth 4 (four) times daily for 5 days. 20 capsule Shaterra Sanzone, [place] C, PA-C     PDMP not reviewed this encounter.   Honour Schwieger, [place] C, PA-C 03/20/20 1254

## 2020-03-20 NOTE — Discharge Instructions (Signed)
Apply triamcinolone cream twice daily to area to help with local inflammation swelling and itching May use Zyrtec/Claritin in the morning, Benadryl at nighttime to further help with itching Developing increased redness or pain associated with this area may begin Keflex 4 times daily x5 days  Please follow-up for any concerns, symptoms not improving or worsening

## 2020-03-20 NOTE — ED Triage Notes (Signed)
Pt has bug bite on center lower abdomen. Bite appears red with scab. Pt states bite itches. Pt states Cortizone cream did not help.

## 2020-03-21 ENCOUNTER — Ambulatory Visit (INDEPENDENT_AMBULATORY_CARE_PROVIDER_SITE_OTHER): Payer: Medicaid Other | Admitting: General Practice

## 2020-03-21 VITALS — BP 129/59 | HR 73 | Ht 65.0 in | Wt 139.0 lb

## 2020-03-21 DIAGNOSIS — Z3042 Encounter for surveillance of injectable contraceptive: Secondary | ICD-10-CM | POA: Diagnosis not present

## 2020-03-21 MED ORDER — MEDROXYPROGESTERONE ACETATE 150 MG/ML IM SUSP
150.0000 mg | Freq: Once | INTRAMUSCULAR | Status: AC
  Administered 2020-03-21: 150 mg via INTRAMUSCULAR

## 2020-03-21 NOTE — Progress Notes (Signed)
[employer] here for Depo-Provera  Injection.  Injection administered without complication. Patient will return in 3 months for next injection. Not charged for Depo Provera medication as patient brought from pharmacy.  Marylynn Pearson, RN 03/21/2020  1:43 PM

## 2020-03-26 NOTE — Progress Notes (Signed)
Patient was assessed and managed by nursing staff during this encounter. I have reviewed the chart and agree with the documentation and plan. I have also made any necessary editorial changes.  Aurther Harlin, MD 03/26/2020 2:14 PM   

## 2020-06-06 ENCOUNTER — Ambulatory Visit: Payer: Medicaid Other

## 2020-06-13 ENCOUNTER — Ambulatory Visit (INDEPENDENT_AMBULATORY_CARE_PROVIDER_SITE_OTHER): Payer: Medicaid Other

## 2020-06-13 ENCOUNTER — Other Ambulatory Visit: Payer: Self-pay

## 2020-06-13 VITALS — Wt 148.6 lb

## 2020-06-13 DIAGNOSIS — Z3042 Encounter for surveillance of injectable contraceptive: Secondary | ICD-10-CM | POA: Diagnosis not present

## 2020-06-13 MED ORDER — MEDROXYPROGESTERONE ACETATE 150 MG/ML IM SUSP
150.0000 mg | Freq: Once | INTRAMUSCULAR | Status: AC
  Administered 2020-06-13: 150 mg via INTRAMUSCULAR

## 2020-06-13 NOTE — Progress Notes (Signed)
Agree with A & P. 

## 2020-06-13 NOTE — Progress Notes (Signed)
[employer] here for Depo-Provera  Injection.  Injection administered without complication. Patient will return in 3 months for next injection.  Henrietta Dine, CMA 06/13/2020  2:51 PM

## 2020-07-03 ENCOUNTER — Emergency Department (HOSPITAL_BASED_OUTPATIENT_CLINIC_OR_DEPARTMENT_OTHER): Payer: Medicaid Other

## 2020-07-03 ENCOUNTER — Other Ambulatory Visit: Payer: Self-pay

## 2020-07-03 ENCOUNTER — Encounter (HOSPITAL_BASED_OUTPATIENT_CLINIC_OR_DEPARTMENT_OTHER): Payer: Self-pay

## 2020-07-03 DIAGNOSIS — W010XXA Fall on same level from slipping, tripping and stumbling without subsequent striking against object, initial encounter: Secondary | ICD-10-CM | POA: Diagnosis not present

## 2020-07-03 DIAGNOSIS — S93505A Unspecified sprain of left lesser toe(s), initial encounter: Secondary | ICD-10-CM | POA: Insufficient documentation

## 2020-07-03 DIAGNOSIS — M79675 Pain in left toe(s): Secondary | ICD-10-CM | POA: Diagnosis present

## 2020-07-03 NOTE — ED Triage Notes (Signed)
Pt slipped and fell ~11am out of a work [employer], fell onto left foot. Was able to walk with limp after, Slight swelling to left pinky toe

## 2020-07-04 ENCOUNTER — Emergency Department (HOSPITAL_BASED_OUTPATIENT_CLINIC_OR_DEPARTMENT_OTHER)
Admission: EM | Admit: 2020-07-04 | Discharge: 2020-07-04 | Disposition: A | Discharge status: Home / Self Care | Payer: Medicaid Other | Attending: Emergency Medicine | Admitting: Emergency Medicine

## 2020-07-04 DIAGNOSIS — W1789XA Other fall from one level to another, initial encounter: Secondary | ICD-10-CM

## 2020-07-04 DIAGNOSIS — S93505A Unspecified sprain of left lesser toe(s), initial encounter: Secondary | ICD-10-CM

## 2020-07-04 NOTE — ED Provider Notes (Signed)
[facility] Provider Note: Lowella Dell, MD, FACEP  CSN: [id number] MRN: [id number] ARRIVAL: 07/03/20 at 2304 ROOM: MH11/[facility]11   CHIEF COMPLAINT  Fall   HISTORY OF PRESENT ILLNESS  07/04/20 2:40 AM Diamond Berry is a 29 y.o. female who slipped and fell about 11 AM while getting out of a work [employer] yesterday.  She fell onto her left foot.  She is now having pain in her left foot, notably the left fifth toe.  She is able to walk but with a limp.  She rates her pain is a 7 out of 10, worse with weightbearing.  She denies other injury.  She took 40 mg of ibuprofen about 3 hours ago with some relief.   History reviewed. No pertinent past medical history.  Past Surgical History:  Procedure Laterality Date   NO PAST SURGERIES      Family History  Problem Relation Age of Onset   Hypertension Mother    Diabetes Mother    Hypertension Father    Thyroid disease Sister    Hypertension Sister     Social History   Tobacco Use   Smoking status: Never Smoker   Smokeless tobacco: Never Used  Vaping Use   Vaping Use: Never used  Substance Use Topics   Alcohol use: Yes    Comment: special occasions   Drug use: Not Currently    Frequency: 1.0 times per week    Types: Marijuana    Prior to Admission medications   Medication Sig Start Date End Date Taking? Authorizing Provider  cycloSPORINE (RESTASIS) 0.05 % ophthalmic emulsion 1 drop 2 (two) times daily.    [provider]  medroxyPROGESTERone (DEPO-PROVERA) 150 MG/ML injection Inject 1 mL (150 mg total) into the muscle every 3 (three) months. 01/04/20   Hermina Staggers, MD    Allergies Patient has no known allergies.   REVIEW OF SYSTEMS  Negative except as noted here or in the History of Present Illness.   PHYSICAL EXAMINATION  Initial Vital Signs Blood pressure 98/81, pulse 88, temperature 98.5 F (36.9 C), temperature source Oral, resp. rate 14, height 5\' 4"  (1.626 m), weight 68 kg,  SpO2 98 %.  Examination General: Well-developed, well-nourished female in no acute distress; appearance consistent with age of record HENT: normocephalic; atraumatic Eyes: Normal appearance Neck: supple Heart: regular rate and rhythm Lungs: clear to auscultation bilaterally Abdomen: soft; nondistended; nontender; bowel sounds present Extremities: No deformity; pulses normal; tenderness and erythema of left fifth toe Neurologic: Awake, alert and oriented; motor function intact in all extremities and symmetric; no facial droop Skin: Warm and dry Psychiatric: Normal mood and affect   RESULTS  Summary of this visit's results, reviewed and interpreted by myself:   EKG Interpretation  Date/Time:    Ventricular Rate:    PR Interval:    QRS Duration:   QT Interval:    QTC Calculation:   R Axis:     Text Interpretation:        Laboratory Studies: No results found for this or any previous visit (from the past 24 hour(s)). Imaging Studies: DG Foot Complete Left  Result Date: 07/03/2020 CLINICAL DATA:  Status post trauma. EXAM: LEFT FOOT - COMPLETE 3+ VIEW COMPARISON:  None. FINDINGS: There is no evidence of an acute fracture or dislocation. A small chronic deformity is seen along the dorsal aspect of the mid left foot. There is no evidence of arthropathy or other focal bone abnormality. Soft tissues are unremarkable. IMPRESSION: No  acute osseous abnormality. Electronically Signed   By: Aram Candela M.D.   On: 07/03/2020 23:31    ED COURSE and MDM  Nursing notes, initial and subsequent vitals signs, including pulse oximetry, reviewed and interpreted by myself.  Vitals:   07/03/20 2310 07/03/20 2311  BP: 98/81   Pulse: 88   Resp: 14   Temp: 98.5 F (36.9 C)   TempSrc: Oral   SpO2: 98%   Weight:  68 kg  Height:  5\' 4"  (1.626 m)   Medications - No data to display  Presentation consistent with sprain of the left fifth toe.  Patient is requesting crutches due to pain with  ambulation.  PROCEDURES  Procedures   ED DIAGNOSES     ICD-10-CM   1. Fall from stationary vehicle, initial encounter  W17.89XA   2. Sprain of fifth toe of left foot, initial encounter  06-18-1989        J57.017B, MD 07/04/20 [phone]

## 2020-08-01 NOTE — Telephone Encounter (Signed)
erroe

## 2020-08-28 ENCOUNTER — Telehealth: Payer: Self-pay | Admitting: Family Medicine

## 2020-08-28 NOTE — Telephone Encounter (Signed)
Called patient to speak to her about her Medicaid. She hung up the phone, and I called her again. She did not answer, and I could not leave a voicemail message. She has [employer] Medicaid, and we can not see her because it is out of network.

## 2020-08-29 ENCOUNTER — Ambulatory Visit: Payer: Medicaid Other

## 2020-10-21 ENCOUNTER — Other Ambulatory Visit: Payer: Self-pay

## 2020-10-21 ENCOUNTER — Ambulatory Visit
Admission: EM | Admit: 2020-10-21 | Discharge: 2020-10-21 | Discharge status: Against Medical Advice | Payer: Medicaid Other

## 2021-03-27 DIAGNOSIS — N939 Abnormal uterine and vaginal bleeding, unspecified: Secondary | ICD-10-CM | POA: Diagnosis not present

## 2021-04-24 ENCOUNTER — Other Ambulatory Visit: Payer: Self-pay

## 2021-04-24 ENCOUNTER — Emergency Department (HOSPITAL_COMMUNITY)
Admission: EM | Admit: 2021-04-24 | Discharge: 2021-04-24 | Disposition: A | Discharge status: Home / Self Care | Payer: Medicaid Other | Attending: Emergency Medicine | Admitting: Emergency Medicine

## 2021-04-24 ENCOUNTER — Emergency Department (HOSPITAL_COMMUNITY): Payer: Medicaid Other

## 2021-04-24 DIAGNOSIS — R079 Chest pain, unspecified: Secondary | ICD-10-CM

## 2021-04-24 DIAGNOSIS — R61 Generalized hyperhidrosis: Secondary | ICD-10-CM | POA: Insufficient documentation

## 2021-04-24 DIAGNOSIS — R0602 Shortness of breath: Secondary | ICD-10-CM | POA: Insufficient documentation

## 2021-04-24 DIAGNOSIS — R110 Nausea: Secondary | ICD-10-CM | POA: Insufficient documentation

## 2021-04-24 LAB — I-STAT BETA HCG BLOOD, ED (MC, WL, AP ONLY): I-stat hCG, quantitative: <5 m[IU]/mL (ref <5)

## 2021-04-24 LAB — BASIC METABOLIC PANEL
Anion gap: 8 (ref 5–15)
BUN: 8 mg/dL (ref 6–20)
CO2: 21 mmol/L — ABNORMAL LOW (ref 22–32)
Calcium: 9.2 mg/dL (ref 8.9–10.3)
Chloride: 108 mmol/L (ref 98–111)
Creatinine, Ser: 0.97 mg/dL (ref 0.44–1.00)
GFR, Estimated: >60 mL/min (ref >60)
Glucose, Bld: 102 mg/dL — ABNORMAL HIGH (ref 70–99)
Potassium: 3.2 mmol/L — ABNORMAL LOW (ref 3.5–5.1)
Sodium: 137 mmol/L (ref 135–145)

## 2021-04-24 LAB — CBC
HCT: 35.8 % — ABNORMAL LOW (ref 36.0–46.0)
Hemoglobin: 11.7 g/dL — ABNORMAL LOW (ref 12.0–15.0)
MCH: 22.2 pg — ABNORMAL LOW (ref 26.0–34.0)
MCHC: 32.7 g/dL (ref 30.0–36.0)
MCV: 67.8 fL — ABNORMAL LOW (ref 80.0–100.0)
Platelets: 266 10*3/uL (ref 150–400)
RBC: 5.28 MIL/uL — ABNORMAL HIGH (ref 3.87–5.11)
RDW: 15.3 % (ref 11.5–15.5)
WBC: 6.5 10*3/uL (ref 4.0–10.5)
nRBC: 0 % (ref 0.0–0.2)

## 2021-04-24 LAB — TROPONIN I (HIGH SENSITIVITY): Troponin I (High Sensitivity): 5 ng/L (ref <18)

## 2021-04-24 MED ORDER — ASPIRIN 81 MG PO CHEW
324.0000 mg | CHEWABLE_TABLET | Freq: Once | ORAL | Status: AC
  Administered 2021-04-24: 324 mg via ORAL
  Filled 2021-04-24: qty 4

## 2021-04-24 NOTE — ED Triage Notes (Signed)
Pt c/o left sided chest pain onset 3:30 am while sleeping. Pain described as pressure with radiation to the back, SOB, nausea, and diaphoresis. No past cardiac HX. No acute distress in triage.

## 2021-04-24 NOTE — Discharge Instructions (Addendum)
Please return to the emergency department for any severe worsening symptoms

## 2021-04-24 NOTE — ED Provider Notes (Signed)
Emergency Medicine Provider Triage Evaluation Note  Diamond Berry , a 30 y.o. female  was evaluated in triage.  Pt complains of left sided chest pain.  Onset 0330.  Awakened patient from sleep.  Reports associated nausea, SOB and back pain.  Denies cardiac hx.    Review of Systems  Positive: Cp, sob Negative: Fever, vomiting  Physical Exam  BP 139/69 (BP Location: Right Arm)   Pulse 60   Temp 98 F (36.7 C) (Oral)   Resp 14   Ht 5\' 4"  (1.626 m)   Wt 68 kg   SpO2 100%   BMI 25.75 kg/m  Gen:   Awake, no distress   Resp:  Normal effort  MSK:   Moves extremities without difficulty  Other:    Medical Decision Making  Medically screening exam initiated at 4:52 AM.  Appropriate orders placed.  Yailine Ballard was informed that the remainder of the evaluation will be completed by another provider, this initial triage assessment does not replace that evaluation, and the importance of remaining in the ED until their evaluation is complete.  Chest pain   Redmond School, PA-C 04/24/21 0453    04/26/21, MD 04/24/21 [phone]

## 2021-04-24 NOTE — ED Provider Notes (Signed)
[facility] EMERGENCY DEPARTMENT Provider Note   CSN: [id number] Arrival date & time: 04/24/21  0436     History Chief Complaint  Patient presents with   Chest Pain    Diamond Berry is a 30 y.o. female.   Chest Pain  This patient is an otherwise healthy 30 year old female who has no past medical history, takes no daily medications, she does not smoke or drink.  She presents to the hospital after having some chest pain that started about 5 hours ago, this awoke her from sleep, it was a heavy pressure on her chest with associated shortness of breath diaphoresis and nausea.  It lasted for a short time and then completely resolved after being given ibuprofen in the emergency department.  She has no coughing, no fever, she denies swelling of her legs, she is completely symptom-free at this time.  She never has exertional symptoms.  The only thing that was different this week was that she took Plan B medication.  She has no abdominal pain she is not nauseated and has no other symptoms at this time.  There is no early family history of heart disease, her risk for heart disease is virtually 0.  No past medical history on file.  Patient Active Problem List   Diagnosis Date Noted   Encounter for IUD removal 01/04/2020   Encounter for surveillance of injectable contraceptive 01/04/2020    Past Surgical History:  Procedure Laterality Date   NO PAST SURGERIES       OB History     Gravida  2   Para      Term      Preterm      AB      Living  2      SAB      IAB      Ectopic      Multiple      Live Births  2           Family History  Problem Relation Age of Onset   Hypertension Mother    Diabetes Mother    Hypertension Father    Thyroid disease Sister    Hypertension Sister     Social History   Tobacco Use   Smoking status: Never   Smokeless tobacco: Never  Vaping Use   Vaping Use: Never used  Substance Use Topics   Alcohol use:  Yes    Comment: special occasions   Drug use: Not Currently    Frequency: 1.0 times per week    Types: Marijuana    Home Medications Prior to Admission medications   Medication Sig Start Date End Date Taking? Authorizing Provider  acetaminophen (TYLENOL) 500 MG tablet Take 500 mg by mouth every 6 (six) hours as needed for moderate pain or headache.   Yes [provider]  ibuprofen (ADVIL) 200 MG tablet Take 200-400 mg by mouth every 6 (six) hours as needed for headache or moderate pain.   Yes [provider]  Multiple Vitamins-Minerals (ONE-A-DAY WOMENS PO) Take 1 tablet by mouth daily.   Yes [provider]  medroxyPROGESTERone (DEPO-PROVERA) 150 MG/ML injection Inject 1 mL (150 mg total) into the muscle every 3 (three) months. Patient not taking: Reported on 04/24/2021 01/04/20   Hermina Staggers, MD    Allergies    Patient has no known allergies.  Review of Systems   Review of Systems  Cardiovascular:  Positive for chest pain.  All other systems reviewed and  are negative.  Physical Exam Updated Vital Signs BP (!) 123/50 (BP Location: Right Arm)   Pulse (!) 56   Temp 98 F (36.7 C) (Oral)   Resp 18   Ht 1.626 m (5\' 4" )   Wt 68 kg   SpO2 100%   BMI 25.75 kg/m   Physical Exam Vitals and nursing note reviewed.  Constitutional:      General: She is not in acute distress.    Appearance: She is well-developed.  HENT:     Head: Normocephalic and atraumatic.     Mouth/Throat:     Pharynx: No oropharyngeal exudate.  Eyes:     General: No scleral icterus.       Right eye: No discharge.        Left eye: No discharge.     Conjunctiva/sclera: Conjunctivae normal.     Pupils: Pupils are equal, round, and reactive to light.  Neck:     Thyroid: No thyromegaly.     Vascular: No JVD.  Cardiovascular:     Rate and Rhythm: Normal rate and regular rhythm.     Heart sounds: Normal heart sounds. No murmur heard.   No friction rub. No gallop.  Pulmonary:      Effort: Pulmonary effort is normal. No respiratory distress.     Breath sounds: Normal breath sounds. No wheezing or rales.  Abdominal:     General: Bowel sounds are normal. There is no distension.     Palpations: Abdomen is soft. There is no mass.     Tenderness: There is no abdominal tenderness.  Musculoskeletal:        General: No tenderness. Normal range of motion.     Cervical back: Normal range of motion and neck supple.  Lymphadenopathy:     Cervical: No cervical adenopathy.  Skin:    General: Skin is warm and dry.     Findings: No erythema or rash.  Neurological:     Mental Status: She is alert.     Coordination: Coordination normal.  Psychiatric:        Behavior: Behavior normal.    ED Results / Procedures / Treatments   Labs (all labs ordered are listed, but only abnormal results are displayed) Labs Reviewed  BASIC METABOLIC PANEL - Abnormal; Notable for the following components:      Result Value   Potassium 3.2 (*)    CO2 21 (*)    Glucose, Bld 102 (*)    All other components within normal limits  CBC - Abnormal; Notable for the following components:   RBC 5.28 (*)    Hemoglobin 11.7 (*)    HCT 35.8 (*)    MCV 67.8 (*)    MCH 22.2 (*)    All other components within normal limits  I-STAT BETA HCG BLOOD, ED ([facility], [facility], [facility] ONLY)  TROPONIN I (HIGH SENSITIVITY)    EKG EKG Interpretation  Date/Time:  Wednesday April 24 2021 04:46:05 EDT Ventricular Rate:  72 PR Interval:  182 QRS Duration: 102 QT Interval:  410 QTC Calculation: 448 R Axis:   53 Text Interpretation: Normal sinus rhythm with sinus arrhythmia Normal ECG No old tracing to compare Confirmed by 01-07-1990 (Dione Booze) on 04/24/2021 6:15:10 AM  Radiology DG Chest 2 View  Result Date: 04/24/2021 CLINICAL DATA:  30 year old female with left side chest pain which woke her at 0330 hours. Pain radiating to the back. EXAM: CHEST - 2 VIEW COMPARISON:  None. FINDINGS: Normal lung volumes. Normal cardiac  size and mediastinal contours. Visualized tracheal air column is within normal limits. Both lungs are clear. No pneumothorax or pleural effusion. No osseous abnormality identified. Negative visible bowel gas pattern. IMPRESSION: Negative.  No cardiopulmonary abnormality. Electronically Signed   By: Odessa Fleming M.D.   On: 04/24/2021 05:37    Procedures Procedures   Medications Ordered in ED Medications  aspirin chewable tablet 324 mg (324 mg Oral Given 04/24/21 0455)    ED Course  I have reviewed the triage vital signs and the nursing notes.  Pertinent labs & imaging results that were available during my care of the patient were reviewed by me and considered in my medical decision making (see chart for details).    MDM Rules/Calculators/A&P                          The patient's exam is reassuring, she has absolutely no symptoms at this time she has no tenderness over her chest, she has a soft nontender abdomen no edema normal vitals.  At this time the patient is stable for discharge, I recommended that she follow-up closely should her symptoms worsen but she has a negative troponin negative EKG negative chest x-ray and normal labs.  She is agreeable to the plan.  She is not tachycardic hypoxic and has no shortness of breath, that she does not have any signs of pulmonary embolism either  Final Clinical Impression(s) / ED Diagnoses Final diagnoses:  None    Rx / DC Orders ED Discharge Orders     None        Eber Hong, MD 04/24/21 [phone]

## 2021-07-30 ENCOUNTER — Encounter (HOSPITAL_COMMUNITY): Payer: Self-pay

## 2021-07-30 ENCOUNTER — Emergency Department (HOSPITAL_COMMUNITY)
Admission: EM | Admit: 2021-07-30 | Discharge: 2021-07-31 | Disposition: A | Discharge status: Home / Self Care | Payer: Medicaid Other | Attending: Emergency Medicine | Admitting: Emergency Medicine

## 2021-07-30 DIAGNOSIS — Z20822 Contact with and (suspected) exposure to covid-19: Secondary | ICD-10-CM | POA: Insufficient documentation

## 2021-07-30 DIAGNOSIS — J101 Influenza due to other identified influenza virus with other respiratory manifestations: Secondary | ICD-10-CM

## 2021-07-30 DIAGNOSIS — J09X2 Influenza due to identified novel influenza A virus with other respiratory manifestations: Secondary | ICD-10-CM | POA: Diagnosis not present

## 2021-07-30 DIAGNOSIS — R509 Fever, unspecified: Secondary | ICD-10-CM | POA: Diagnosis present

## 2021-07-30 NOTE — ED Triage Notes (Signed)
Patient reports developing sore throat, fever and cough since Sunday, 2 negative covid tests taken at home. Patient endorses nausea and vomiting that started yesterday. Pt reports emesis is dark in color and taste like iron.

## 2021-07-31 ENCOUNTER — Emergency Department (HOSPITAL_COMMUNITY): Payer: Medicaid Other

## 2021-07-31 LAB — RESP PANEL BY RT-PCR (FLU A&B, COVID) ARPGX2
Influenza A by PCR: POSITIVE — AB
Influenza B by PCR: NEGATIVE
SARS Coronavirus 2 by RT PCR: NEGATIVE

## 2021-07-31 LAB — CBC WITH DIFFERENTIAL/PLATELET
Abs Immature Granulocytes: 0.01 10*3/uL (ref 0.00–0.07)
Basophils Absolute: 0 10*3/uL (ref 0.0–0.1)
Basophils Relative: 1 %
Eosinophils Absolute: 0 10*3/uL (ref 0.0–0.5)
Eosinophils Relative: 0 %
HCT: 34.5 % — ABNORMAL LOW (ref 36.0–46.0)
Hemoglobin: 11.5 g/dL — ABNORMAL LOW (ref 12.0–15.0)
Immature Granulocytes: 0 %
Lymphocytes Relative: 36 %
Lymphs Abs: 0.8 10*3/uL (ref 0.7–4.0)
MCH: 22.5 pg — ABNORMAL LOW (ref 26.0–34.0)
MCHC: 33.3 g/dL (ref 30.0–36.0)
MCV: 67.4 fL — ABNORMAL LOW (ref 80.0–100.0)
Monocytes Absolute: 0.4 10*3/uL (ref 0.1–1.0)
Monocytes Relative: 18 %
Neutro Abs: 1 10*3/uL — ABNORMAL LOW (ref 1.7–7.7)
Neutrophils Relative %: 45 %
Platelets: 220 10*3/uL (ref 150–400)
RBC: 5.12 MIL/uL — ABNORMAL HIGH (ref 3.87–5.11)
RDW: 15.2 % (ref 11.5–15.5)
WBC: 2.3 10*3/uL — ABNORMAL LOW (ref 4.0–10.5)
nRBC: 0 % (ref 0.0–0.2)

## 2021-07-31 LAB — COMPREHENSIVE METABOLIC PANEL
ALT: 70 U/L — ABNORMAL HIGH (ref 0–44)
AST: 66 U/L — ABNORMAL HIGH (ref 15–41)
Albumin: 3.8 g/dL (ref 3.5–5.0)
Alkaline Phosphatase: 109 U/L (ref 38–126)
Anion gap: 8 (ref 5–15)
BUN: 12 mg/dL (ref 6–20)
CO2: 20 mmol/L — ABNORMAL LOW (ref 22–32)
Calcium: 8.8 mg/dL — ABNORMAL LOW (ref 8.9–10.3)
Chloride: 105 mmol/L (ref 98–111)
Creatinine, Ser: 0.88 mg/dL (ref 0.44–1.00)
GFR, Estimated: >60 mL/min (ref >60)
Glucose, Bld: 94 mg/dL (ref 70–99)
Potassium: 3.8 mmol/L (ref 3.5–5.1)
Sodium: 133 mmol/L — ABNORMAL LOW (ref 135–145)
Total Bilirubin: 0.6 mg/dL (ref 0.3–1.2)
Total Protein: 7.8 g/dL (ref 6.5–8.1)

## 2021-07-31 LAB — I-STAT BETA HCG BLOOD, ED (MC, WL, AP ONLY): I-stat hCG, quantitative: <5 m[IU]/mL (ref <5)

## 2021-07-31 MED ORDER — ONDANSETRON 4 MG PO TBDP: 4.0000 mg | ORAL_TABLET | Freq: Three times a day (TID) | ORAL | 0 refills | Status: AC | PRN

## 2021-07-31 MED ORDER — FAMOTIDINE 20 MG PO TABS: 20.0000 mg | ORAL_TABLET | Freq: Two times a day (BID) | ORAL | 0 refills | Status: AC | PRN

## 2021-07-31 MED ORDER — BENZONATATE 100 MG PO CAPS: 100.0000 mg | ORAL_CAPSULE | Freq: Three times a day (TID) | ORAL | 0 refills | Status: AC

## 2021-07-31 MED ORDER — SODIUM CHLORIDE 0.9 % IV BOLUS
1000.0000 mL | Freq: Once | INTRAVENOUS | Status: AC
  Administered 2021-07-31: 1000 mL via INTRAVENOUS

## 2021-07-31 MED ORDER — ALUM & MAG HYDROXIDE-SIMETH 200-200-20 MG/5ML PO SUSP
30.0000 mL | Freq: Once | ORAL | Status: AC
  Administered 2021-07-31: 30 mL via ORAL
  Filled 2021-07-31: qty 30

## 2021-07-31 MED ORDER — ONDANSETRON HCL 4 MG/2ML IJ SOLN
4.0000 mg | Freq: Once | INTRAMUSCULAR | Status: AC
  Administered 2021-07-31: 4 mg via INTRAVENOUS
  Filled 2021-07-31: qty 2

## 2021-07-31 NOTE — Discharge Instructions (Addendum)
Your influenza a test returned positive in the emergency department today, we suspect this is the cause of your symptoms.  We are sending you home with the following medicines: - Tessalon: Take every 8 hours as needed for coughing - Zofran: Take every hours as needed for nausea and vomiting - Pepcid: Take every 12 hours as needed for indigestion/heartburn.  Please take Motrin/Tylenol per over-the-counter dosing as needed for fever/discomfort.  We have prescribed you new medication(s) today. Discuss the medications prescribed today with your pharmacist as they can have adverse effects and interactions with your other medicines including over the counter and prescribed medications. Seek medical evaluation if you start to experience new or abnormal symptoms after taking one of these medicines, seek care immediately if you start to experience difficulty breathing, feeling of your throat closing, facial swelling, or rash as these could be indications of a more serious allergic reaction  Your blood work showed that your anemia is similar to prior labs you have had done, it also showed that your white blood cell count was mildly low and that your liver function tests were mildly elevated, these are things to have rechecked by your primary care provider.   Please follow-up with primary care.  Return to the emergency department for new or worsening symptoms including but not limited to new or worsening pain, inability to keep fluids down, dark/tarry stool, coffee-ground appearing vomit, trouble breathing, passing out, or any other concerns.

## 2021-07-31 NOTE — ED Provider Notes (Signed)
[facility]-EMERGENCY DEPT Provider Note   CSN: [id number] Arrival date & time: 07/30/21  2306     History Chief Complaint  Patient presents with   flu like symptoms    Diamond Berry is a 30 y.o. female who presents to the emergency department with complaints of flulike symptoms for the past 3 days.  Patient states that she has had fever, chills, sore throat, cough, fatigue, as well as nausea and vomiting.  She has had a total of 4 episodes of emesis, appeared somewhat red/dark, however no coffee-ground appearance.  No alleviating or aggravating factors to the symptoms.  She states that she is due for her menstrual cycle and is having some cramps that are typical of this, no other abdominal pain.  She denies chest pain, melena, hematochezia, dysuria, or syncope.  She has had negative COVID test at home.  HPI     History reviewed. No pertinent past medical history.  Patient Active Problem List   Diagnosis Date Noted   Encounter for IUD removal 01/04/2020   Encounter for surveillance of injectable contraceptive 01/04/2020    Past Surgical History:  Procedure Laterality Date   NO PAST SURGERIES       OB History     Gravida  2   Para      Term      Preterm      AB      Living  2      SAB      IAB      Ectopic      Multiple      Live Births  2           Family History  Problem Relation Age of Onset   Hypertension Mother    Diabetes Mother    Hypertension Father    Thyroid disease Sister    Hypertension Sister     Social History   Tobacco Use   Smoking status: Never   Smokeless tobacco: Never  Vaping Use   Vaping Use: Never used  Substance Use Topics   Alcohol use: Yes    Comment: special occasions   Drug use: Not Currently    Frequency: 1.0 times per week    Types: Marijuana    Home Medications Prior to Admission medications   Medication Sig Start Date End Date Taking? Authorizing Provider  benzonatate  (TESSALON) 100 MG capsule Take 1 capsule (100 mg total) by mouth every 8 (eight) hours. 07/31/21  Yes Lindel Marcell R, PA-C  famotidine (PEPCID) 20 MG tablet Take 1 tablet (20 mg total) by mouth 2 (two) times daily as needed for heartburn or indigestion. 07/31/21  Yes Belissa Kooy R, PA-C  ondansetron (ZOFRAN ODT) 4 MG disintegrating tablet Take 1 tablet (4 mg total) by mouth every 8 (eight) hours as needed for nausea or vomiting. 07/31/21  Yes Neima Lacross R, PA-C  acetaminophen (TYLENOL) 500 MG tablet Take 500 mg by mouth every 6 (six) hours as needed for moderate pain or headache.    [provider]  ibuprofen (ADVIL) 200 MG tablet Take 200-400 mg by mouth every 6 (six) hours as needed for headache or moderate pain.    [provider]  Multiple Vitamins-Minerals (ONE-A-DAY WOMENS PO) Take 1 tablet by mouth daily.    [provider]    Allergies    Patient has no known allergies.  Review of Systems   Review of Systems  Constitutional:  Positive for chills, fatigue and  fever.  HENT:  Positive for sore throat. Negative for congestion and ear pain.   Respiratory:  Positive for cough.   Cardiovascular:  Negative for chest pain.  Gastrointestinal:  Positive for nausea and vomiting. Negative for abdominal pain, anal bleeding and blood in stool.  Genitourinary:  Positive for pelvic pain (menstrual cramps). Negative for dysuria.  Neurological:  Negative for syncope.  All other systems reviewed and are negative.  Physical Exam Updated Vital Signs BP 129/71 (BP Location: Right Arm)   Pulse 80   Temp 99.2 F (37.3 C) (Oral)   Resp 14   Ht 5\' 4"  (1.626 m)   Wt 65.8 kg   SpO2 100%   BMI 24.89 kg/m   Physical Exam Vitals and nursing note reviewed.  Constitutional:      General: She is not in acute distress.    Appearance: She is well-developed.  HENT:     Head: Normocephalic and atraumatic.     Right Ear: Ear canal normal. Tympanic  membrane is not perforated, erythematous, retracted or bulging.     Left Ear: Ear canal normal. Tympanic membrane is not perforated, erythematous, retracted or bulging.     Ears:     Comments: No mastoid erythema/swelling/tenderness.     Nose:     Right Sinus: No maxillary sinus tenderness or frontal sinus tenderness.     Left Sinus: No maxillary sinus tenderness or frontal sinus tenderness.     Mouth/Throat:     Pharynx: Uvula midline. No oropharyngeal exudate or posterior oropharyngeal erythema.     Comments: Posterior oropharynx is symmetric appearing. Patient tolerating own secretions without difficulty. No trismus. No drooling. No hot potato voice. No swelling beneath the tongue, submandibular compartment is soft.  Eyes:     General:        Right eye: No discharge.        Left eye: No discharge.     Conjunctiva/sclera: Conjunctivae normal.     Pupils: Pupils are equal, round, and reactive to light.  Cardiovascular:     Rate and Rhythm: Normal rate and regular rhythm.     Heart sounds: No murmur heard. Pulmonary:     Effort: Pulmonary effort is normal. No respiratory distress.     Breath sounds: Normal breath sounds. No wheezing, rhonchi or rales.  Abdominal:     General: There is no distension.     Palpations: Abdomen is soft.     Tenderness: There is no abdominal tenderness. There is no guarding or rebound.  Musculoskeletal:     Cervical back: Normal range of motion and neck supple. No edema or rigidity.  Lymphadenopathy:     Cervical: No cervical adenopathy.  Skin:    General: Skin is warm and dry.     Findings: No rash.  Neurological:     Mental Status: She is alert.  Psychiatric:        Behavior: Behavior normal.    ED Results / Procedures / Treatments   Labs (all labs ordered are listed, but only abnormal results are displayed) Labs Reviewed  RESP PANEL BY RT-PCR (FLU A&B, COVID) ARPGX2 - Abnormal; Notable for the following components:      Result Value    Influenza A by PCR POSITIVE (*)    All other components within normal limits  COMPREHENSIVE METABOLIC PANEL - Abnormal; Notable for the following components:   Sodium 133 (*)    CO2 20 (*)    Calcium 8.8 (*)    AST 66 (*)  ALT 70 (*)    All other components within normal limits  CBC WITH DIFFERENTIAL/PLATELET - Abnormal; Notable for the following components:   WBC 2.3 (*)    RBC 5.12 (*)    Hemoglobin 11.5 (*)    HCT 34.5 (*)    MCV 67.4 (*)    MCH 22.5 (*)    Neutro Abs 1.0 (*)    All other components within normal limits  I-STAT BETA HCG BLOOD, ED ([facility], [facility], [facility] ONLY)    EKG None  Radiology DG Chest 2 View  Result Date: 07/31/2021 CLINICAL DATA:  Cough and fever for 2 days, initial encounter EXAM: CHEST - 2 VIEW COMPARISON:  None. FINDINGS: Cardiac shadow is within normal limits. Lungs are well aerated bilaterally. Overlying soft tissue density is seen bilaterally. No bony abnormality is seen. IMPRESSION: No acute abnormality noted. Electronically Signed   By: Alcide Clever M.D.   On: 07/31/2021 01:38    Procedures Procedures   Medications Ordered in ED Medications  sodium chloride 0.9 % bolus 1,000 mL (0 mLs Intravenous Stopped 07/31/21 0218)  ondansetron (ZOFRAN) injection 4 mg (4 mg Intravenous Given 07/31/21 0107)  alum & mag hydroxide-simeth (MAALOX/MYLANTA) 200-200-20 MG/5ML suspension 30 mL (30 mLs Oral Given 07/31/21 0228)    ED Course  I have reviewed the triage vital signs and the nursing notes.  Pertinent labs & imaging results that were available during my care of the patient were reviewed by me and considered in my medical decision making (see chart for details).  Diamond Berry was evaluated in Emergency Department on 07/31/2021 for the symptoms described in the history of present illness. He/she was evaluated in the context of the global COVID-19 pandemic, which necessitated consideration that the patient might be at risk for infection with the  SARS-CoV-2 virus that causes COVID-19. Institutional protocols and algorithms that pertain to the evaluation of patients at risk for COVID-19 are in a state of rapid change based on information released by regulatory bodies including the CDC and federal and state organizations. These policies and algorithms were followed during the patient's care in the ED.    MDM Rules/Calculators/A&P                           Patient presents to the ED with complaints of flulike symptoms.  Patient is nontoxic, resting comfortably, vitals are without significant abnormality.  Additional history obtained:  Additional history obtained from chart review & nursing note review.   Lab Tests:  I Ordered, reviewed, and interpreted labs, which included:  See: Mild anemia similar to prior, mild leukopenia noted which is new. CMP: Mild transaminitis which is also new, no significant electrolyte derangement, mild hypocalcemia/natremia Pregnancy test: Negative COVID test: Negative Influenza test: Positive for flu A  Imaging Studies ordered:  I ordered imaging studies which included chest x-ray, I independently reviewed, formal radiology impression shows:  No acute abnormality noted  ED Course:  Patient given fluids and Zofran with some significant improvement, states she is having some belching/reflux therefore was given a GI cocktail with improvement.  She is tolerating p.o., states she feels much better.  Suspect she is symptomatic from influenza A.  In terms of patient concern for dark/red vomiting, they have should be pictures of this on their phone, does not appear consistent with coffee-ground emesis/hematemesis, additionally hemoglobin/hematocrit are similar to prior and patient's BUN is not elevated therefore do not suspect significant upper GI bleed.  Abdomen  is nontender without peritoneal signs.  Does not appear in respiratory distress.  No meningismus.  Overall appears reasonable for discharge.  We discussed  risks/benefits of Tamiflu which patient has declined.  Will treat supportively. I discussed results, treatment plan, need for follow-up, and return precautions with the patient and her fianc at bedside. Provided opportunity for questions, patient & her fiance confirmed understanding and are in agreement with plan.   Portions of this note were generated with Scientist, clinical (histocompatibility and immunogenetics). Dictation errors may occur despite best attempts at proofreading.  Final Clinical Impression(s) / ED Diagnoses Final diagnoses:  Influenza A    Rx / DC Orders ED Discharge Orders          Ordered    ondansetron (ZOFRAN ODT) 4 MG disintegrating tablet  Every 8 hours PRN        07/31/21 0257    famotidine (PEPCID) 20 MG tablet  2 times daily PRN        07/31/21 0257    benzonatate (TESSALON) 100 MG capsule  Every 8 hours        07/31/21 0257             Cherly Anderson, PA-C 07/31/21 8295    Zadie Rhine, MD 07/31/21 [phone]

## 2021-11-20 IMAGING — CR DG CHEST 2V
2 series · 2 of 2 positions shown · non-contrast
Comparison: None.

CLINICAL DATA: 30-year-old female with left side chest pain which
woke her at 4774 hours. Pain radiating to the back.

EXAM:
CHEST - 2 VIEW

[chest pa]
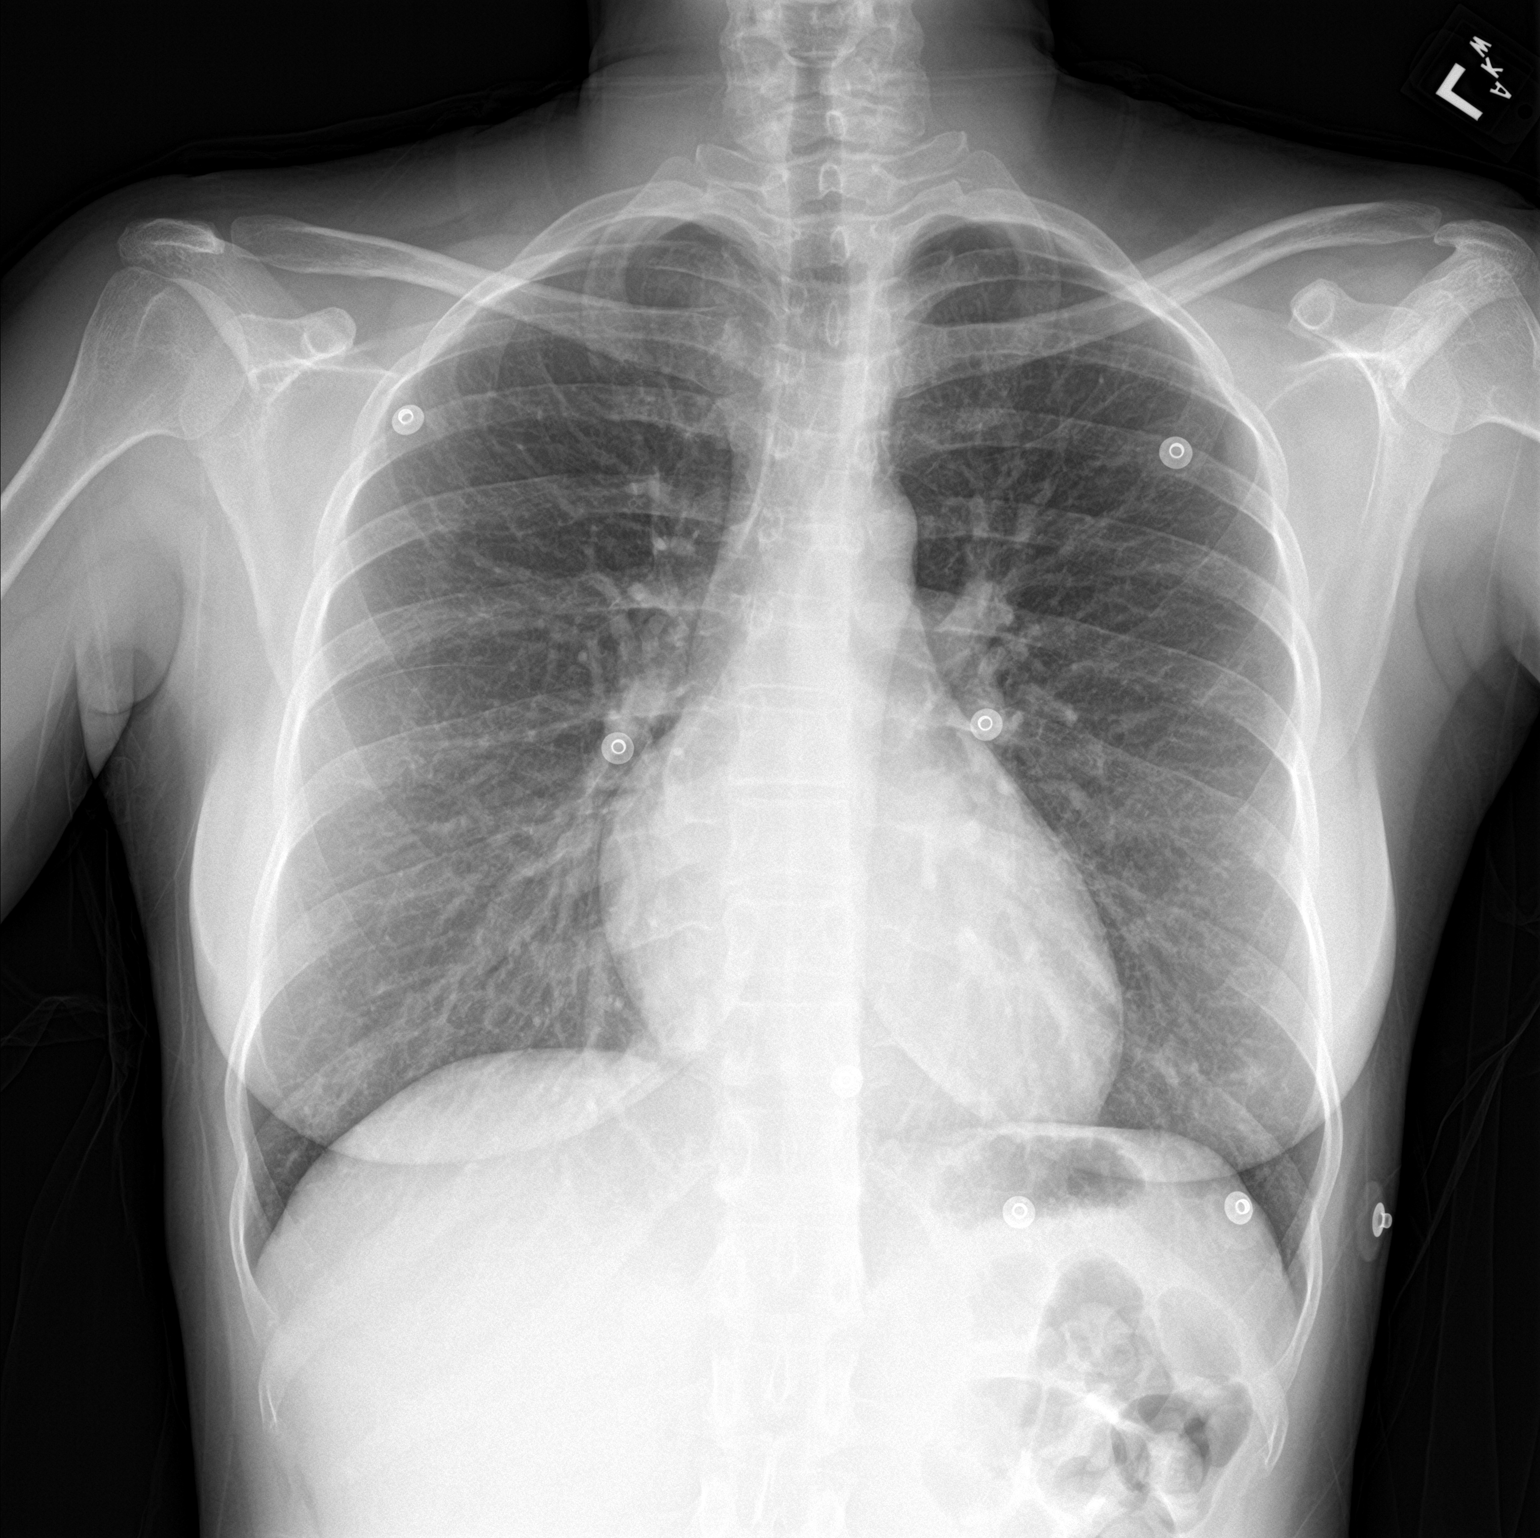

[chest lat]
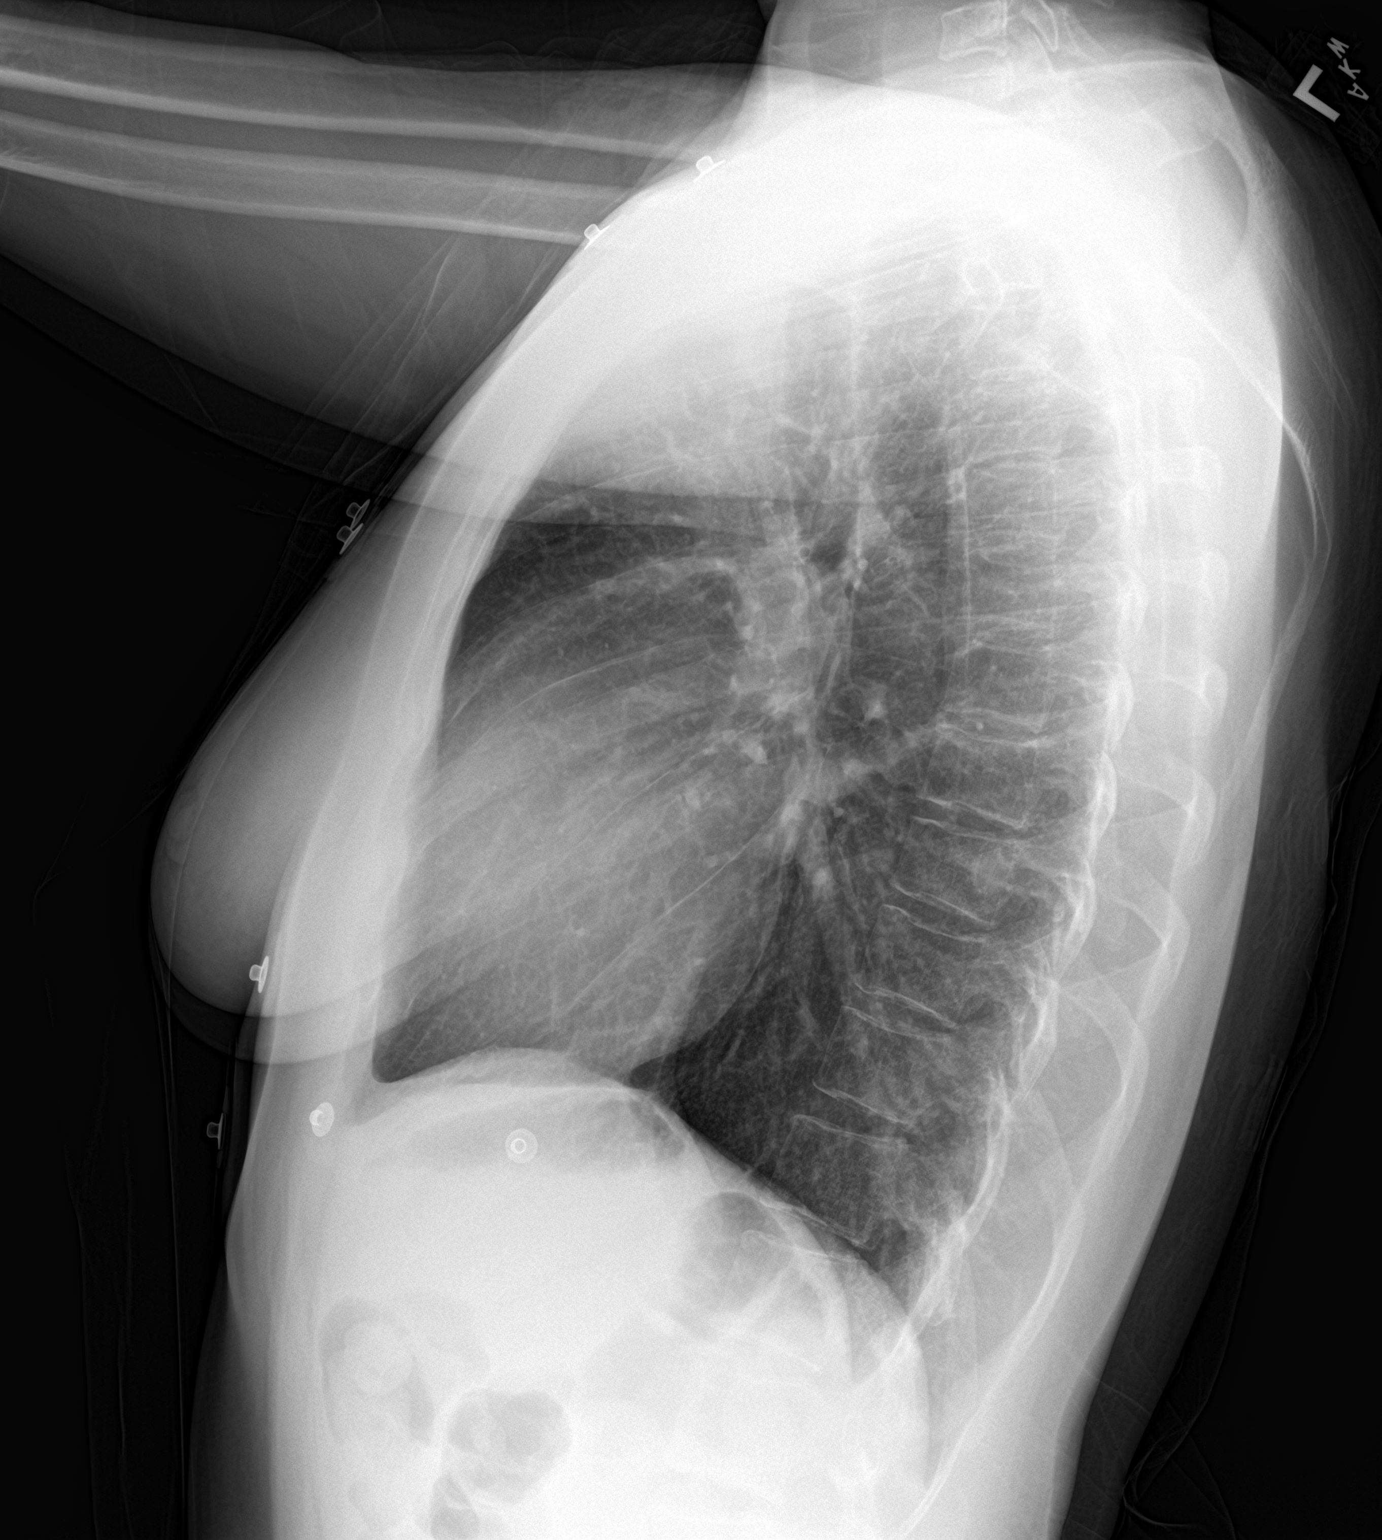

[2 of 2 positions shown; findings below may reference images not displayed]

FINDINGS: Normal lung volumes. Normal cardiac size and mediastinal contours.
Visualized tracheal air column is within normal limits. Both lungs
are clear. No pneumothorax or pleural effusion.

No osseous abnormality identified. Negative visible bowel gas
pattern.
IMPRESSION: Negative.  No cardiopulmonary abnormality.

## 2022-01-20 ENCOUNTER — Ambulatory Visit: Payer: Medicaid Other | Attending: Nurse Practitioner | Admitting: Physical Therapy

## 2022-01-20 ENCOUNTER — Encounter: Payer: Self-pay | Admitting: Physical Therapy

## 2022-01-20 DIAGNOSIS — R252 Cramp and spasm: Secondary | ICD-10-CM | POA: Insufficient documentation

## 2022-01-20 DIAGNOSIS — M542 Cervicalgia: Secondary | ICD-10-CM | POA: Diagnosis present

## 2022-01-20 NOTE — Therapy (Signed)
[facility] ?[address] ?[place], [place], [address] ?Phone: [phone]   Fax:  [phone] ? ?Physical Therapy Evaluation ? ?Patient Details  ?Name: Diamond Berry ?MRN: [id number] ?Date of Birth: 05-16-1991 ?Referring Provider (PT): Zoe Lan ? ? ?Encounter Date: 01/20/2022 ? ? PT End of Session - 01/20/22 2482   ? ? Visit Number 1   ? Number of Visits 12   ? Date for PT Re-Evaluation 04/21/22   ? Authorization Type [employer], okay first 12 visits then would need auth   ? PT Start Time 0800   ? PT Stop Time 0840   ? PT Time Calculation (min) 40 min   ? Activity Tolerance Patient tolerated treatment well   ? Behavior During Therapy [facility] for tasks assessed/performed   ? ?  ?  ? ?  ? ? ?History reviewed. No pertinent past medical history. ? ?Past Surgical History:  ?Procedure Laterality Date  ? NO PAST SURGERIES    ? ? ?There were no vitals filed for this visit. ? ? ? Subjective Assessment - 01/20/22 0808   ? ? Subjective Patient reports that she has had some HA's in the past, reports that over the past 2 months she has had more tension and pain in the neck.  No imaging has been done.  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 Limitations Sitting   ? Patient Stated Goals have less pain and feel like I move easier   ? Currently in Pain? Yes   ? Pain Score 1    ? Pain Location Neck   ? Pain Descriptors / Indicators Spasm;Tightness;Sore   ? Pain Type Acute pain   ? Pain Radiating Towards just HA   ? Pain Onset More than a month ago   ? Pain Frequency Intermittent   ? Aggravating Factors  pain can be up to an 8/10 sitting, using computer, end of day   ? Pain Relieving Factors mm relaxer, rest, early in the AM pain can be 0-1/10   ? Effect of Pain on Daily Activities just hurts, sometimes difficult to concentrate   ? ?  ?  ? ?  ? ? ? ? ? [facility] PT Assessment - 01/20/22 0001   ? ?  ? Assessment  ? Medical Diagnosis cervicalgia and HA   ? Referring Provider  (PT) Zoe Lan   ? Onset Date/Surgical Date 12/20/21   ? Hand Dominance Left   ? Prior Therapy no   ?  ? Precautions  ? Precautions None   ?  ? Balance Screen  ? Has the patient fallen in the past 6 months No   ? Has the patient had a decrease in activity level because of a fear of falling?  No   ? Is the patient reluctant to leave their home because of a fear of falling?  No   ?  ? Home Environment  ? Additional Comments has a 51 and 33 year old, does her housework   ?  ? Prior Function  ? Level of Independence Independent   ? Vocation Full time employment   ? Vocation Requirements on computer   ? Leisure no exercise   ?  ? Posture/Postural Control  ? Posture Comments fwd head, rounded shoulders   ?  ? ROM / Strength  ? AROM / PROM / Strength AROM;Strength   ?  ? AROM  ? Overall AROM Comments cervical ROM WFLs except for  side bending was tight and limited 25% with some pain   ?  ? Strength  ? Overall Strength Comments 4+/5 with some tightness and pain in the upper trap   ?  ? Flexibility  ? Soft Tissue Assessment /Muscle Length --   tight upper trap and levator  ?  ? Palpation  ? Palpation comment tight upper traps, very tight and tender in the paraspinals   ? ?  ?  ? ?  ? ? ? ? ? ? ? ? ? ? ? ? ? ?Objective measurements completed on examination: See above findings.  ? ? ? ? ? ? ? ? ? ? ? ? ? ? ? ? PT Short Term Goals - 01/20/22 0835   ? ?  ? PT SHORT TERM GOAL #1  ? Title independent with initial HEP   ? Time 2   ? Period Weeks   ? Status New   ? ?  ?  ? ?  ? ? ? ? PT Long Term Goals - 01/20/22 0836   ? ?  ? PT LONG TERM GOAL #1  ? Title understand posture and body mechanics   ? Time 12   ? Period Weeks   ? Status New   ?  ? PT LONG TERM GOAL #2  ? Title increase cervical ROM to WNL's   ? Time 12   ? Period Weeks   ? Status New   ?  ? PT LONG TERM GOAL #3  ? Title decrease pain and HA 50%   ? Time 12   ? Period Weeks   ? Status New   ?  ? PT LONG TERM GOAL #4  ? Title hold good sitting posture 5 minutes   ? Time  12   ? Period Weeks   ? Status New   ? ?  ?  ? ?  ? ? ? ? ? ? ? ? ? Plan - 01/20/22 0832   ? ? Clinical Impression Statement Patient reports that she has had HA's in the past but that over the past 2 months she has had a different type of HA with neck pain, the MD felt like it was mm tension in the neck and shoulders, no imaging has been done.  She is very tight in the upper traps and the cervical area, she was very tender in the cervical area.  Her ROM was good some limitation with side bending and had pain with the motions, Posture was with fwd head, rounded shoulders and winged scapulae.  Had some weakness of the shoulders.  Her job is sitting at the computer all day   ? Stability/Clinical Decision Making Stable/Uncomplicated   ? Clinical Decision Making Low   ? Rehab Potential Good   ? PT Frequency 1x / week   ? PT Duration 12 weeks   ? PT Treatment/Interventions ADLs/Self Care Home Management;Electrical Stimulation;Moist Heat;Traction;Ultrasound;Neuromuscular re-education;Therapeutic exercise;Therapeutic activities;Patient/family education;Manual techniques;Dry needling   ? PT Next Visit Plan see how HEP did, continue scapular and postural strength work on the mm spasms   ? Consulted and Agree with Plan of Care Patient   ? ?  ?  ? ?  ? ? ?Patient will benefit from skilled therapeutic intervention in order to improve the following deficits and impairments:  Decreased range of motion, Increased fascial restricitons, Increased muscle spasms, Pain, Impaired flexibility, Improper body mechanics, Postural dysfunction, Decreased strength ? ?Visit Diagnosis: ?Cervicalgia - Plan: PT plan of care cert/re-cert ? ?  Cramp and spasm - Plan: PT plan of care cert/re-cert ? ? ? ? ?Problem List ?Patient Active Problem List  ? Diagnosis Date Noted  ? Encounter for IUD removal 01/04/2020  ? Encounter for surveillance of injectable contraceptive 01/04/2020  ? ? Jearld Lesch, PT ?01/20/2022, 8:38 AM ? ?[place] ?[address] ?[place], [place], [address] ?Phone: [phone]   Fax:  [phone] ? ?Name: Diamond Berry ?MRN: [id number] ?Date of Birth: 12/10/1990 ? ? ?

## 2022-01-20 NOTE — Patient Instructions (Signed)
Access Code: [email or web address] ?URL: [email or web address] ?Date: 01/20/2022 ?Prepared by: Stacie Glaze ? ?Exercises ?- Standing Row with Anchored Resistance  - 1 x daily - 7 x weekly - 1 sets - 10 reps - 3 hold ?- Shoulder extension with resistance - Neutral  - 1 x daily - 7 x weekly - 1 sets - 10 reps - 3 hold ?- Shoulder External Rotation and Scapular Retraction with Resistance  - 1 x daily - 7 x weekly - 1 sets - 10 reps - 3 hold ?- Shoulder Extension with Resistance  - 1 x daily - 7 x weekly - 1 sets - 10 reps - 3 hold ?- Doorway Pec Stretch at 90 Degrees Abduction  - 1 x daily - 7 x weekly - 1 sets - 10 reps - 10 hold ?- Upper Trapezius Stretch  - 1 x daily - 7 x weekly - 1 sets - 10 reps - 10 hold ?- Gentle Levator Scapulae Stretch  - 1 x daily - 7 x weekly - 1 sets - 10 reps - 10 hold ?

## 2022-01-30 ENCOUNTER — Ambulatory Visit: Payer: Medicaid Other | Admitting: Physical Therapy

## 2022-01-30 ENCOUNTER — Encounter: Payer: Self-pay | Admitting: Physical Therapy

## 2022-01-30 DIAGNOSIS — M542 Cervicalgia: Secondary | ICD-10-CM

## 2022-01-30 DIAGNOSIS — R252 Cramp and spasm: Secondary | ICD-10-CM

## 2022-01-30 NOTE — Therapy (Signed)
[facility] ?[address] ?[place], [place], [address] ?Phone: [phone]   Fax:  [phone] ? ?Physical Therapy Treatment ? ?Patient Details  ?Name: Masen Windell ?MRN: [id number] ?Date of Birth: 06/05/1991 ?Referring Provider (PT): Eldridge Abrahams ? ? ?Encounter Date: 01/30/2022 ? ? PT End of Session - 01/30/22 [id number]   ? ? Visit Number 2   ? Number of Visits 12   ? Date for PT Re-Evaluation 04/21/22   ? Authorization Type [employer], okay first 12 visits then would need auth   ? PT Start Time 0800   ? PT Stop Time 0840   ? PT Time Calculation (min) 40 min   ? Activity Tolerance Patient tolerated treatment well   ? Behavior During Therapy [facility] for tasks assessed/performed   ? ?  ?  ? ?  ? ? ?History reviewed. No pertinent past medical history. ? ?Past Surgical History:  ?Procedure Laterality Date  ? NO PAST SURGERIES    ? ? ?There were no vitals filed for this visit. ? ? Subjective Assessment - 01/30/22 0803   ? ? Subjective Patient is feeling good, no pain. She states she hasn't done her HEP as much as she should but when she has it has definitely helped. I've been working on my posture. I got a pillow for my back for my desk and car.   ? Limitations Sitting   ? Currently in Pain? No/denies   ? ?  ?  ? ?  ? ? ? ? ? ? ? ? ? ? ? ? ? ? ? ? ? ? ? ? [place] [facility] PT Treatment/Exercise - 01/30/22 0001   ? ?  ? Exercises  ? Exercises Neck;Lumbar   ?  ? Neck Exercises: Machines for Strengthening  ? UBE (Upper Arm Bike) lvl 2x 6 mins; 3 mins fwd and 3 mins backward   ?  ? Neck Exercises: Standing  ? Other Standing Exercises rows 2x10 red TB; VC's needed for correct posture; ER w/ red TB 2x10; balls rolls on wall both UE 2x10   ? Other Standing Exercises shoulder ext 2x10 red TB   ?  ? Neck Exercises: Seated  ? Shoulder Shrugs 10 reps   ? Shoulder Shrugs Limitations 2 sets   ? Shoulder Rolls 10 reps;Backwards;Forwards   ? Shoulder Flexion Both;10 reps   ? Shoulder Flexion  Limitations 2 sets w/ 2# cane   ? Other Seated Exercise chin tucks 2x10   ?  ? Neck Exercises: Supine  ? Other Supine Exercise IR 2x10 w/ 3#   ?  ? Neck Exercises: Stretches  ? Other Neck Stretches lateral bending both sides 20 secs x2   ? Other Neck Stretches cervical rotation both sides 20 secs 2x; cervical flex 20 secs x2; cervical ext 20 secs x2   ? ?  ?  ? ?  ? ? ? ? ? ? ? ? ? ? ? ? PT Short Term Goals - 01/20/22 0835   ? ?  ? PT SHORT TERM GOAL #1  ? Title independent with initial HEP   ? Time 2   ? Period Weeks   ? Status New   ? ?  ?  ? ?  ? ? ? ? PT Long Term Goals - 01/20/22 0836   ? ?  ? PT LONG TERM GOAL #1  ? Title understand posture and body mechanics   ? Time 12   ? Period Weeks   ?  Status New   ?  ? PT LONG TERM GOAL #2  ? Title increase cervical ROM to WNL's   ? Time 12   ? Period Weeks   ? Status New   ?  ? PT LONG TERM GOAL #3  ? Title decrease pain and HA 50%   ? Time 12   ? Period Weeks   ? Status New   ?  ? PT LONG TERM GOAL #4  ? Title hold good sitting posture 5 minutes   ? Time 12   ? Period Weeks   ? Status New   ? ?  ?  ? ?  ? ? ? ? ? ? ? ? Plan - 01/30/22 0843   ? ? Clinical Impression Statement Patient came in feeling good. She was compliant and did well overall. Did not exhibit much tightness in the neck but patient has been working on her posture. She stated she got a lumbar pillow for work and her car and has improved her posture extremely. Focused today's session on strengthening the upper traps/cervical muscles. Reviewed HEP for correct posture. VC;s needed for correct posture during rows, shoulder ext, and ball rolls on wall.   ? Stability/Clinical Decision Making Stable/Uncomplicated   ? Clinical Decision Making Low   ? Rehab Potential Good   ? PT Frequency 1x / week   ? PT Duration 12 weeks   ? PT Treatment/Interventions ADLs/Self Care Home Management;Electrical Stimulation;Moist Heat;Traction;Ultrasound;Neuromuscular re-education;Therapeutic exercise;Therapeutic  activities;Patient/family education;Manual techniques;Dry needling   ? PT Next Visit Plan progress as tolerated   ? Consulted and Agree with Plan of Care Patient   ? ?  ?  ? ?  ? ? ?Patient will benefit from skilled therapeutic intervention in order to improve the following deficits and impairments:  Decreased range of motion, Increased fascial restricitons, Increased muscle spasms, Pain, Impaired flexibility, Improper body mechanics, Postural dysfunction, Decreased strength ? ?Visit Diagnosis: ?Cervicalgia ? ?Cramp and spasm ? ? ? ? ?Problem List ?Patient Active Problem List  ? Diagnosis Date Noted  ? Encounter for IUD removal 01/04/2020  ? Encounter for surveillance of injectable contraceptive 01/04/2020  ? ? ?Meridian Scherger Chauncey Cruel ?01/30/2022, 8:48 AM ? ?[facility] ?[address] ?[place], [place], [address] ?Phone: [phone]   Fax:  [phone] ? ?Name: Tora Hemker ?MRN: [id number] ?Date of Birth: 1991-02-16 ? ? ? ?

## 2022-02-04 ENCOUNTER — Encounter: Payer: Self-pay | Admitting: Physical Therapy

## 2022-02-04 ENCOUNTER — Ambulatory Visit: Payer: Medicaid Other | Admitting: Physical Therapy

## 2022-02-04 DIAGNOSIS — M542 Cervicalgia: Secondary | ICD-10-CM

## 2022-02-04 DIAGNOSIS — R252 Cramp and spasm: Secondary | ICD-10-CM

## 2022-02-04 NOTE — Therapy (Signed)
[facility] ?[place], [place], [address] ?Phone: [phone]   Fax:  [phone] ? ?Physical Therapy Treatment ? ?Patient Details  ?Name: Diamond Berry ?MRN: [id number] ?Date of Birth: 12-Apr-1991 ?Referring Provider (PT): Eldridge Abrahams ? ? ?Encounter Date: 02/04/2022 ? ? PT End of Session - 02/04/22 0927   ? ? Visit Number 3   ? Number of Visits 12   ? Date for PT Re-Evaluation 04/21/22   ? Authorization Type [employer], [place] first 12 visits then would need auth   ? PT Start Time [phone]   ? PT Stop Time [id number]   ? PT Time Calculation (min) 40 min   ? Activity Tolerance Patient tolerated treatment well   ? Behavior During Therapy [facility] for tasks assessed/performed   ? ?  ?  ? ?  ? ? ?History reviewed. No pertinent past medical history. ? ?Past Surgical History:  ?Procedure Laterality Date  ? NO PAST SURGERIES    ? ? ?There were no vitals filed for this visit. ? ? Subjective Assessment - 02/04/22 0850   ? ? Subjective I'm feeling good but not as good as last time. I'm having some pain today.   ? Currently in Pain? Yes   ? Pain Score 3    ? Pain Location Neck   ? Pain Orientation Posterior   ? Pain Descriptors / Indicators Sore;Tightness   ? ?  ?  ? ?  ? ? ? ? ? ? ? ? ? ? ? ? ? ? ? ? ? ? ? ? [place] Adult PT Treatment/Exercise - 02/04/22 0001   ? ?  ? Neck Exercises: Standing  ? Other Standing Exercises shoulder shrugs w/ 3# dumbells   ? Other Standing Exercises diagnols 2x10 up and down   ?  ? Neck Exercises: Seated  ? Other Seated Exercise chin tucks 2x10   ?  ? Neck Exercises: Stretches  ? Other Neck Stretches AROM lateral bending 20 secs x2 both sides   ?  ? Lumbar Exercises: Aerobic  ? UBE (Upper Arm Bike) lvl 2.2 x6 mins/ 18mins fwd and 3 mins backward   ?  ? Lumbar Exercises: Machines for Strengthening  ? Other Lumbar Machine Exercise lat pulls 2x10 20#   ?  ? Lumbar Exercises: Standing  ? Row AROM;Strengthening;Both;10 reps;Theraband   ? Theraband Level  (Row) Level 2 (Red)   ? Row Limitations 2 sets   ? Shoulder Extension AROM;Strengthening;Both;10 reps;Theraband   ? Theraband Level (Shoulder Extension) Level 2 (Red)   ? Shoulder Extension Limitations 2 sets   ? Other Standing Lumbar Exercises shoulder flexion w/ trunk ext w/ yellow weight ball   ? Other Standing Lumbar Exercises shoulder horizontal abd w/ red tb on wall   ?  ? Lumbar Exercises: Seated  ? Sit to Stand Limitations 2x10 w/ chest press 6#   ? ?  ?  ? ?  ? ? ? ? ? ? ? ? ? ? ? ? PT Short Term Goals - 01/20/22 0835   ? ?  ? PT SHORT TERM GOAL #1  ? Title independent with initial HEP   ? Time 2   ? Period Weeks   ? Status New   ? ?  ?  ? ?  ? ? ? ? PT Long Term Goals - 02/04/22 0934   ? ?  ? PT LONG TERM GOAL #1  ? Title understand posture and body mechanics   ?  Time 12   ? Period Weeks   ? Status On-going   ? ?  ?  ? ?  ? ? ? ? ? ? ? ? Plan - 02/04/22 0928   ? ? Clinical Impression Statement Patient came in with a little more pain than last visit. She stated she was sore after last session. Educated patient on how to use lumbar pillow for car and posture. VC's needed for correct posture during diagonals on wall, rows, lat pulls, STS w/ chest press and shrugs.   ? Stability/Clinical Decision Making Stable/Uncomplicated   ? Clinical Decision Making Low   ? Rehab Potential Good   ? PT Frequency 1x / week   ? PT Duration 12 weeks   ? PT Treatment/Interventions ADLs/Self Care Home Management;Electrical Stimulation;Moist Heat;Traction;Ultrasound;Neuromuscular re-education;Therapeutic exercise;Therapeutic activities;Patient/family education;Manual techniques;Dry needling   ? PT Next Visit Plan progress as tolerated   ? Consulted and Agree with Plan of Care Patient   ? ?  ?  ? ?  ? ? ?Patient will benefit from skilled therapeutic intervention in order to improve the following deficits and impairments:  Decreased range of motion, Increased fascial restricitons, Increased muscle spasms, Pain, Impaired flexibility,  Improper body mechanics, Postural dysfunction, Decreased strength ? ?Visit Diagnosis: ?Cervicalgia ? ?Cramp and spasm ? ? ? ? ?Problem List ?Patient Active Problem List  ? Diagnosis Date Noted  ? Encounter for IUD removal 01/04/2020  ? Encounter for surveillance of injectable contraceptive 01/04/2020  ? ? ?Marquis Down Chauncey Cruel ?02/04/2022, 9:34 AM ? ?[facility] ?[address] ?[place], [place], [address] ?Phone: [phone]   Fax:  [phone] ? ?Name: Diamond Berry ?MRN: [id number] ?Date of Birth: 1990-12-19 ? ? ? ?

## 2022-02-11 ENCOUNTER — Ambulatory Visit: Payer: Medicaid Other | Attending: Nurse Practitioner | Admitting: Physical Therapy

## 2022-02-11 ENCOUNTER — Encounter: Payer: Self-pay | Admitting: Physical Therapy

## 2022-02-11 DIAGNOSIS — R252 Cramp and spasm: Secondary | ICD-10-CM | POA: Diagnosis present

## 2022-02-11 DIAGNOSIS — M542 Cervicalgia: Secondary | ICD-10-CM | POA: Insufficient documentation

## 2022-02-11 NOTE — Therapy (Signed)
[facility] ?[address] ?[address] ?[place], [place], [address] ?Phone: [phone]   Fax:  [phone] ? ?Physical Therapy Treatment ? ?Patient Details  ?Name: Diamond Berry ?MRN: [id number] ?Date of Birth: 1990/10/23 ?Referring Provider (PT): Eldridge Abrahams ? ? ?Encounter Date: 02/11/2022 ? ? PT End of Session - 02/11/22 0840   ? ? Visit Number 4   ? Number of Visits 12   ? Date for PT Re-Evaluation 04/21/22   ? PT Start Time [phone]   ? PT Stop Time 0840   ? PT Time Calculation (min) 43 min   ? Activity Tolerance Patient tolerated treatment well   ? Behavior During Therapy [facility] for tasks assessed/performed   ? ?  ?  ? ?  ? ? ?History reviewed. No pertinent past medical history. ? ?Past Surgical History:  ?Procedure Laterality Date  ? NO PAST SURGERIES    ? ? ?There were no vitals filed for this visit. ? ? Subjective Assessment - 02/11/22 0809   ? ? Subjective I am doing better, I cut my hair, sleeping better, I think the exercises are doing well   ? Currently in Pain? No/denies   ? ?  ?  ? ?  ? ? ? ? ? ? ? ? ? ? ? ? ? ? ? ? ? ? ? ? [place] Adult PT Treatment/Exercise - 02/11/22 0001   ? ?  ? Neck Exercises: Machines for Strengthening  ? UBE (Upper Arm Bike) lvl 3x 6 mins; 3 mins fwd and 3 mins backward   ? Cybex Row 15# 2x10   ? Cybex Chest Press 5# 2x10   ? Lat Pull 20# 2x10   ?  ? Neck Exercises: Standing  ? Other Standing Exercises shoulder shrugs w/ 5# dumbells with upper trap and levator stretches   ?  ? Neck Exercises: Seated  ? Other Seated Exercise 4# bent over row, 3# extension and bent over reverse flies   ?  ? Manual Therapy  ? Manual Therapy Manual Traction;Neural Stretch   ? Manual Traction occipital release   ? Neural Stretch UE's and shoulder depression   ? ?  ?  ? ?  ? ? ? ? ? ? ? ? ? ? ? ? PT Short Term Goals - 01/20/22 0835   ? ?  ? PT SHORT TERM GOAL #1  ? Title independent with initial HEP   ? Time 2   ? Period Weeks   ? Status New   ? ?  ?  ? ?  ? ? ? ? PT  Long Term Goals - 02/11/22 0842   ? ?  ? PT LONG TERM GOAL #1  ? Title understand posture and body mechanics   ? Status Partially Met   ?  ? PT LONG TERM GOAL #2  ? Title increase cervical ROM to WNL's   ? Status Partially Met   ?  ? PT LONG TERM GOAL #3  ? Title decrease pain and HA 50%   ? Status Partially Met   ? ?  ?  ? ?  ? ? ? ? ? ? ? ? Plan - 02/11/22 0840   ? ? Clinical Impression Statement I continued to focus treatment on the scapular and postural strength, she tolerated well but did have diffiuclty in the arms, fatigue and weakness.  She does demonstrate better posture.  Tried to do some manual stretches   ? PT Next Visit Plan progress as  tolerated   ? Consulted and Agree with Plan of Care Patient   ? ?  ?  ? ?  ? ? ?Patient will benefit from skilled therapeutic intervention in order to improve the following deficits and impairments:  Decreased range of motion, Increased fascial restricitons, Increased muscle spasms, Pain, Impaired flexibility, Improper body mechanics, Postural dysfunction, Decreased strength ? ?Visit Diagnosis: ?Cervicalgia ? ?Cramp and spasm ? ? ? ? ?Problem List ?Patient Active Problem List  ? Diagnosis Date Noted  ? Encounter for IUD removal 01/04/2020  ? Encounter for surveillance of injectable contraceptive 01/04/2020  ? ? Sumner Boast, PT ?02/11/2022, 8:43 AM ? ?[facility] ?[address] ?[address] ?[place], [place], [address] ?Phone: [phone]   Fax:  [phone] ? ?Name: Diamond Berry ?MRN: [id number] ?Date of Birth: 07-01-1991 ? ? ? ?
